# Patient Record
Sex: Female | Born: 1935 | Race: White | Hispanic: No | Smoking: Former smoker
Health system: Southern US, Community
[De-identification: ages and names within clinical notes are randomized; demographics above are authoritative.]

## PROBLEM LIST (undated history)

## (undated) DIAGNOSIS — M1990 Unspecified osteoarthritis, unspecified site: Secondary | ICD-10-CM

## (undated) DIAGNOSIS — K219 Gastro-esophageal reflux disease without esophagitis: Secondary | ICD-10-CM

## (undated) DIAGNOSIS — I10 Essential (primary) hypertension: Secondary | ICD-10-CM

## (undated) DIAGNOSIS — M549 Dorsalgia, unspecified: Secondary | ICD-10-CM

## (undated) DIAGNOSIS — E7800 Pure hypercholesterolemia, unspecified: Secondary | ICD-10-CM

## (undated) HISTORY — PX: KIDNEY STONE SURGERY: SHX686

## (undated) HISTORY — PX: HERNIA REPAIR: SHX51

## (undated) HISTORY — PX: ABDOMINAL SURGERY: SHX537

## (undated) HISTORY — PX: ABDOMINAL HYSTERECTOMY: SHX81

## (undated) HISTORY — PX: CHOLECYSTECTOMY: SHX55

---

## 2016-11-02 ENCOUNTER — Emergency Department (HOSPITAL_COMMUNITY): Payer: Medicare Other

## 2016-11-02 ENCOUNTER — Encounter (HOSPITAL_COMMUNITY): Payer: Self-pay | Admitting: Emergency Medicine

## 2016-11-02 ENCOUNTER — Inpatient Hospital Stay (HOSPITAL_COMMUNITY)
Admission: EM | Admit: 2016-11-02 | Discharge: 2016-11-07 | DRG: 291 | Disposition: A | Discharge status: Home Health | Payer: Medicare Other | Attending: Internal Medicine | Admitting: Internal Medicine

## 2016-11-02 DIAGNOSIS — Z79891 Long term (current) use of opiate analgesic: Secondary | ICD-10-CM

## 2016-11-02 DIAGNOSIS — R531 Weakness: Secondary | ICD-10-CM

## 2016-11-02 DIAGNOSIS — Z91048 Other nonmedicinal substance allergy status: Secondary | ICD-10-CM

## 2016-11-02 DIAGNOSIS — R0989 Other specified symptoms and signs involving the circulatory and respiratory systems: Secondary | ICD-10-CM | POA: Diagnosis present

## 2016-11-02 DIAGNOSIS — E876 Hypokalemia: Secondary | ICD-10-CM | POA: Diagnosis present

## 2016-11-02 DIAGNOSIS — I2510 Atherosclerotic heart disease of native coronary artery without angina pectoris: Secondary | ICD-10-CM | POA: Diagnosis present

## 2016-11-02 DIAGNOSIS — K219 Gastro-esophageal reflux disease without esophagitis: Secondary | ICD-10-CM | POA: Diagnosis present

## 2016-11-02 DIAGNOSIS — Z886 Allergy status to analgesic agent status: Secondary | ICD-10-CM | POA: Diagnosis not present

## 2016-11-02 DIAGNOSIS — G8929 Other chronic pain: Secondary | ICD-10-CM | POA: Diagnosis present

## 2016-11-02 DIAGNOSIS — R627 Adult failure to thrive: Secondary | ICD-10-CM | POA: Diagnosis present

## 2016-11-02 DIAGNOSIS — Z7902 Long term (current) use of antithrombotics/antiplatelets: Secondary | ICD-10-CM

## 2016-11-02 DIAGNOSIS — N3001 Acute cystitis with hematuria: Secondary | ICD-10-CM

## 2016-11-02 DIAGNOSIS — D649 Anemia, unspecified: Secondary | ICD-10-CM | POA: Diagnosis present

## 2016-11-02 DIAGNOSIS — R748 Abnormal levels of other serum enzymes: Secondary | ICD-10-CM | POA: Diagnosis not present

## 2016-11-02 DIAGNOSIS — R778 Other specified abnormalities of plasma proteins: Secondary | ICD-10-CM | POA: Diagnosis present

## 2016-11-02 DIAGNOSIS — G3184 Mild cognitive impairment, so stated: Secondary | ICD-10-CM | POA: Diagnosis present

## 2016-11-02 DIAGNOSIS — I739 Peripheral vascular disease, unspecified: Secondary | ICD-10-CM | POA: Diagnosis present

## 2016-11-02 DIAGNOSIS — R7989 Other specified abnormal findings of blood chemistry: Secondary | ICD-10-CM

## 2016-11-02 DIAGNOSIS — R609 Edema, unspecified: Secondary | ICD-10-CM

## 2016-11-02 DIAGNOSIS — R918 Other nonspecific abnormal finding of lung field: Secondary | ICD-10-CM | POA: Diagnosis present

## 2016-11-02 DIAGNOSIS — F419 Anxiety disorder, unspecified: Secondary | ICD-10-CM | POA: Diagnosis present

## 2016-11-02 DIAGNOSIS — Z79899 Other long term (current) drug therapy: Secondary | ICD-10-CM

## 2016-11-02 DIAGNOSIS — I130 Hypertensive heart and chronic kidney disease with heart failure and stage 1 through stage 4 chronic kidney disease, or unspecified chronic kidney disease: Secondary | ICD-10-CM | POA: Diagnosis present

## 2016-11-02 DIAGNOSIS — M6281 Muscle weakness (generalized): Secondary | ICD-10-CM

## 2016-11-02 DIAGNOSIS — E785 Hyperlipidemia, unspecified: Secondary | ICD-10-CM | POA: Diagnosis present

## 2016-11-02 DIAGNOSIS — M1990 Unspecified osteoarthritis, unspecified site: Secondary | ICD-10-CM | POA: Diagnosis present

## 2016-11-02 DIAGNOSIS — I7789 Other specified disorders of arteries and arterioles: Secondary | ICD-10-CM | POA: Diagnosis present

## 2016-11-02 DIAGNOSIS — Z881 Allergy status to other antibiotic agents status: Secondary | ICD-10-CM

## 2016-11-02 DIAGNOSIS — Z841 Family history of disorders of kidney and ureter: Secondary | ICD-10-CM

## 2016-11-02 DIAGNOSIS — N183 Chronic kidney disease, stage 3 unspecified: Secondary | ICD-10-CM

## 2016-11-02 DIAGNOSIS — I5043 Acute on chronic combined systolic (congestive) and diastolic (congestive) heart failure: Secondary | ICD-10-CM

## 2016-11-02 DIAGNOSIS — I509 Heart failure, unspecified: Secondary | ICD-10-CM | POA: Diagnosis not present

## 2016-11-02 DIAGNOSIS — L03116 Cellulitis of left lower limb: Secondary | ICD-10-CM | POA: Diagnosis present

## 2016-11-02 DIAGNOSIS — E7800 Pure hypercholesterolemia, unspecified: Secondary | ICD-10-CM | POA: Diagnosis present

## 2016-11-02 DIAGNOSIS — I10 Essential (primary) hypertension: Secondary | ICD-10-CM | POA: Diagnosis present

## 2016-11-02 DIAGNOSIS — N390 Urinary tract infection, site not specified: Secondary | ICD-10-CM | POA: Diagnosis present

## 2016-11-02 DIAGNOSIS — Z87891 Personal history of nicotine dependence: Secondary | ICD-10-CM

## 2016-11-02 DIAGNOSIS — I5041 Acute combined systolic (congestive) and diastolic (congestive) heart failure: Secondary | ICD-10-CM | POA: Diagnosis not present

## 2016-11-02 DIAGNOSIS — R600 Localized edema: Secondary | ICD-10-CM

## 2016-11-02 DIAGNOSIS — Z87442 Personal history of urinary calculi: Secondary | ICD-10-CM

## 2016-11-02 HISTORY — DX: Essential (primary) hypertension: I10

## 2016-11-02 HISTORY — DX: Pure hypercholesterolemia, unspecified: E78.00

## 2016-11-02 HISTORY — DX: Dorsalgia, unspecified: M54.9

## 2016-11-02 HISTORY — DX: Gastro-esophageal reflux disease without esophagitis: K21.9

## 2016-11-02 HISTORY — DX: Unspecified osteoarthritis, unspecified site: M19.90

## 2016-11-02 LAB — CBC WITH DIFFERENTIAL/PLATELET
BASOS ABS: 0 10*3/uL (ref 0.0–0.1)
BASOS PCT: 0 %
EOS ABS: 0 10*3/uL (ref 0.0–0.7)
Eosinophils Relative: 0 %
HEMATOCRIT: 31.8 % — AB (ref 36.0–46.0)
HEMOGLOBIN: 10.3 g/dL — AB (ref 12.0–15.0)
Lymphocytes Relative: 10 %
Lymphs Abs: 0.5 10*3/uL — ABNORMAL LOW (ref 0.7–4.0)
MCH: 31.2 pg (ref 26.0–34.0)
MCHC: 32.4 g/dL (ref 30.0–36.0)
MCV: 96.4 fL (ref 78.0–100.0)
Monocytes Absolute: 0.4 10*3/uL (ref 0.1–1.0)
Monocytes Relative: 9 %
NEUTROS ABS: 3.9 10*3/uL (ref 1.7–7.7)
NEUTROS PCT: 81 %
Platelets: 179 10*3/uL (ref 150–400)
RBC: 3.3 MIL/uL — AB (ref 3.87–5.11)
RDW: 14.5 % (ref 11.5–15.5)
WBC: 4.8 10*3/uL (ref 4.0–10.5)

## 2016-11-02 LAB — BRAIN NATRIURETIC PEPTIDE: B Natriuretic Peptide: 2954 pg/mL — ABNORMAL HIGH (ref 0.0–100.0)

## 2016-11-02 LAB — COMPREHENSIVE METABOLIC PANEL
ALBUMIN: 3.5 g/dL (ref 3.5–5.0)
ALK PHOS: 71 U/L (ref 38–126)
ALT: 21 U/L (ref 14–54)
AST: 34 U/L (ref 15–41)
Anion gap: 10 (ref 5–15)
BILIRUBIN TOTAL: 0.7 mg/dL (ref 0.3–1.2)
BUN: 40 mg/dL — AB (ref 6–20)
CALCIUM: 9.1 mg/dL (ref 8.9–10.3)
CO2: 25 mmol/L (ref 22–32)
Chloride: 107 mmol/L (ref 101–111)
Creatinine, Ser: 1.56 mg/dL — ABNORMAL HIGH (ref 0.44–1.00)
GFR calc Af Amer: 35 mL/min — ABNORMAL LOW (ref >60)
GFR calc non Af Amer: 30 mL/min — ABNORMAL LOW (ref >60)
GLUCOSE: 89 mg/dL (ref 65–99)
Potassium: 3.4 mmol/L — ABNORMAL LOW (ref 3.5–5.1)
SODIUM: 142 mmol/L (ref 135–145)
TOTAL PROTEIN: 6.3 g/dL — AB (ref 6.5–8.1)

## 2016-11-02 LAB — URINALYSIS, ROUTINE W REFLEX MICROSCOPIC
Bilirubin Urine: NEGATIVE
Glucose, UA: NEGATIVE mg/dL
KETONES UR: NEGATIVE mg/dL
NITRITE: NEGATIVE
PH: 5 (ref 5.0–8.0)
PROTEIN: 100 mg/dL — AB
Specific Gravity, Urine: 1.015 (ref 1.005–1.030)

## 2016-11-02 LAB — TROPONIN I
TROPONIN I: 0.05 ng/mL — AB (ref <0.03)
Troponin I: 0.05 ng/mL (ref <0.03)

## 2016-11-02 LAB — MAGNESIUM: MAGNESIUM: 1.7 mg/dL (ref 1.7–2.4)

## 2016-11-02 MED ORDER — PANTOPRAZOLE SODIUM 40 MG PO TBEC
40.0000 mg | DELAYED_RELEASE_TABLET | Freq: Every day | ORAL | Status: DC
  Administered 2016-11-02 – 2016-11-07 (×6): 40 mg via ORAL
  Filled 2016-11-02 (×6): qty 1

## 2016-11-02 MED ORDER — DEXTROSE 5 % IV SOLN
1.0000 g | Freq: Once | INTRAVENOUS | Status: AC
  Administered 2016-11-02: 1 g via INTRAVENOUS
  Filled 2016-11-02: qty 10

## 2016-11-02 MED ORDER — ENOXAPARIN SODIUM 30 MG/0.3ML ~~LOC~~ SOLN
30.0000 mg | SUBCUTANEOUS | Status: DC
  Administered 2016-11-02 – 2016-11-06 (×5): 30 mg via SUBCUTANEOUS
  Filled 2016-11-02 (×5): qty 0.3

## 2016-11-02 MED ORDER — FENOFIBRATE 54 MG PO TABS
54.0000 mg | ORAL_TABLET | Freq: Every day | ORAL | Status: DC
  Administered 2016-11-03 – 2016-11-07 (×5): 54 mg via ORAL
  Filled 2016-11-02 (×7): qty 1

## 2016-11-02 MED ORDER — HYDROCODONE-ACETAMINOPHEN 5-325 MG PO TABS
1.0000 | ORAL_TABLET | Freq: Four times a day (QID) | ORAL | Status: DC | PRN
  Administered 2016-11-02 – 2016-11-03 (×3): 1 via ORAL
  Filled 2016-11-02 (×3): qty 1

## 2016-11-02 MED ORDER — FESOTERODINE FUMARATE ER 4 MG PO TB24
4.0000 mg | ORAL_TABLET | Freq: Every day | ORAL | Status: DC
Start: 2016-11-03 — End: 2016-11-07
  Administered 2016-11-03 – 2016-11-07 (×5): 4 mg via ORAL
  Filled 2016-11-02 (×7): qty 1

## 2016-11-02 MED ORDER — CLORAZEPATE DIPOTASSIUM 7.5 MG PO TABS
7.5000 mg | ORAL_TABLET | Freq: Three times a day (TID) | ORAL | Status: DC
  Administered 2016-11-02 – 2016-11-07 (×15): 7.5 mg via ORAL
  Filled 2016-11-02 (×15): qty 1

## 2016-11-02 MED ORDER — POTASSIUM CHLORIDE CRYS ER 20 MEQ PO TBCR
20.0000 meq | EXTENDED_RELEASE_TABLET | Freq: Once | ORAL | Status: AC
Start: 2016-11-02 — End: 2016-11-02
  Administered 2016-11-02: 20 meq via ORAL
  Filled 2016-11-02: qty 1

## 2016-11-02 MED ORDER — METOLAZONE 5 MG PO TABS
5.0000 mg | ORAL_TABLET | Freq: Every day | ORAL | Status: DC
  Administered 2016-11-03 – 2016-11-04 (×2): 5 mg via ORAL
  Filled 2016-11-02 (×2): qty 1

## 2016-11-02 MED ORDER — FUROSEMIDE 10 MG/ML IJ SOLN
20.0000 mg | Freq: Two times a day (BID) | INTRAMUSCULAR | Status: DC
  Administered 2016-11-03 – 2016-11-04 (×3): 20 mg via INTRAVENOUS
  Filled 2016-11-02 (×3): qty 2

## 2016-11-02 MED ORDER — MAGNESIUM SULFATE 2 GM/50ML IV SOLN
2.0000 g | Freq: Once | INTRAVENOUS | Status: AC
  Administered 2016-11-03: 2 g via INTRAVENOUS
  Filled 2016-11-02: qty 50

## 2016-11-02 MED ORDER — DEXTROSE 5 % IV SOLN
1.0000 g | INTRAVENOUS | Status: DC
  Filled 2016-11-02: qty 10

## 2016-11-02 MED ORDER — SODIUM CHLORIDE 0.9 % IV BOLUS (SEPSIS)
250.0000 mL | Freq: Once | INTRAVENOUS | Status: AC
  Administered 2016-11-02: 250 mL via INTRAVENOUS

## 2016-11-02 MED ORDER — VITAMIN D (ERGOCALCIFEROL) 1.25 MG (50000 UNIT) PO CAPS
50000.0000 [IU] | ORAL_CAPSULE | ORAL | Status: DC
  Administered 2016-11-03: 50000 [IU] via ORAL
  Filled 2016-11-02: qty 1

## 2016-11-02 MED ORDER — IPRATROPIUM-ALBUTEROL 0.5-2.5 (3) MG/3ML IN SOLN: 3.0000 mL | Freq: Four times a day (QID) | RESPIRATORY_TRACT | Status: DC | PRN

## 2016-11-02 MED ORDER — FERROUS SULFATE 325 (65 FE) MG PO TABS
325.0000 mg | ORAL_TABLET | Freq: Every day | ORAL | Status: DC
  Administered 2016-11-03 – 2016-11-07 (×5): 325 mg via ORAL
  Filled 2016-11-02 (×5): qty 1

## 2016-11-02 MED ORDER — FUROSEMIDE 10 MG/ML IJ SOLN
40.0000 mg | Freq: Once | INTRAMUSCULAR | Status: AC
  Administered 2016-11-02: 40 mg via INTRAVENOUS
  Filled 2016-11-02: qty 4

## 2016-11-02 MED ORDER — BENAZEPRIL HCL 10 MG PO TABS
10.0000 mg | ORAL_TABLET | Freq: Every day | ORAL | Status: DC
Start: 2016-11-03 — End: 2016-11-07
  Administered 2016-11-03 – 2016-11-07 (×5): 10 mg via ORAL
  Filled 2016-11-02 (×6): qty 1

## 2016-11-02 MED ORDER — SODIUM CHLORIDE 0.9 % IV SOLN: INTRAVENOUS | Status: DC

## 2016-11-02 MED ORDER — CLOPIDOGREL BISULFATE 75 MG PO TABS
75.0000 mg | ORAL_TABLET | Freq: Every day | ORAL | Status: DC
Start: 2016-11-03 — End: 2016-11-07
  Administered 2016-11-03 – 2016-11-07 (×5): 75 mg via ORAL
  Filled 2016-11-02 (×5): qty 1

## 2016-11-02 MED ORDER — HYDROCODONE-ACETAMINOPHEN 5-325 MG PO TABS
1.0000 | ORAL_TABLET | Freq: Once | ORAL | Status: AC
  Administered 2016-11-02: 1 via ORAL
  Filled 2016-11-02: qty 1

## 2016-11-02 MED ORDER — POTASSIUM CHLORIDE CRYS ER 20 MEQ PO TBCR
20.0000 meq | EXTENDED_RELEASE_TABLET | Freq: Two times a day (BID) | ORAL | Status: DC
  Administered 2016-11-02 – 2016-11-05 (×7): 20 meq via ORAL
  Filled 2016-11-02 (×7): qty 1

## 2016-11-02 NOTE — ED Triage Notes (Addendum)
Patient c/o swelling in lower legs bilaterally. Per family x1 week in which she seen orthopedic doctor for back, in which family showed the doctor swelling and patient was given cortisone injections in knees. swelling in knees. Patient was told that she needed to see PCP but unable to get an appointment till next week.  Patient seen at Urgent Care and was given 5 days of Lasix. Patient legs "weeping and leathery" states family.  Per family patient c/o pain in legs and generalized weakness. Family states patient lethergic. Patient alert and oriented at this time.

## 2016-11-02 NOTE — ED Notes (Signed)
CRITICAL VALUE ALERT  Critical value received:  Troponin = 0.05  Date of notification:  11/02/16  Time of notification:  1659  Critical value read back:Yes.    Nurse who received alert:  Antony OdeaJason Nehal Witting  MD notified (1st page):  Zackowski  Time of first page:  1659  MD notified (2nd page):  Time of second page:  Responding MD:  Deretha EmoryZackowski  Time MD responded:  [phone]

## 2016-11-02 NOTE — H&P (Signed)
History and Physical    Kristy Espinoza ZOX:[id number] DOB: 1934/05/01 DOA: 11/02/2016  PCP: PROVIDER NOT IN SYSTEM   Patient coming from: Home.  Chief Complaint: Lower extremities swelling.  HPI: Kristy Espinoza is a 83 y.o. female with medical history significant of osteoarthritis, chronic back pain, GERD, hyperlipidemia, hypertension who is coming to the emergency department with progressively worse weakness and lower extremity edema for about a week. She denies chest pain, palpitations, diaphoresis, PND, orthopnea but complains of postural dizziness, dyspnea on exertion and fatigue. Patient daughter states that they went to urgent care and the patient was prescribed metolazone 5 mg by mouth daily. She denies fever, chills, abdominal pain, nausea, emesis, diarrhea or constipation. She complains of dysuria and frequency.  ED Course: The patient received Rocephin 1 g IVPB, Norco 5/325 one tablet by mouth, furosemide 40 mg IVP, KCl 20 mEq, DuoNeb and earlier to 250 mL bolus by EDP.  Her workup shows EKG with minimal ST depression on inferior leads, WBC 4.8, hemoglobin 10.3 g/dL and platelets 811. Her troponin level was 0.05 ng/mL and her BNP was 2954 pg/dL. Her sodium was 142, potassium 3.4, chloride 107, bicarbonate 25 mmol/L. Her BUN was 40, creatinine 1.56 and glucose 89 mg/dL.  Imaging: Her chest radiograph did not show any acute abnormalities. However, CT of the chest without contrast showed extensive CAD, multiple nodules  Review of Systems: As per HPI otherwise 10 point review of systems negative.    Past Medical History:  Diagnosis Date  . Arthritis   . Back pain   . GERD (gastroesophageal reflux disease)   . High cholesterol   . Hypertension     Past Surgical History:  Procedure Laterality Date  . ABDOMINAL HYSTERECTOMY    . ABDOMINAL SURGERY    . CHOLECYSTECTOMY    . HERNIA REPAIR    . KIDNEY STONE SURGERY       reports that she quit smoking about 25 years ago. Her  smoking use included Cigarettes. She has a 40.00 pack-year smoking history. She has never used smokeless tobacco. She reports that she does not drink alcohol or use drugs.  Allergies  Allergen Reactions  . Ativan [Lorazepam] Other (See Comments)    Hallucinations   . Ciprocin-Fluocin-Procin [Fluocinolone] Diarrhea  . Ibuprofen Other (See Comments)    Due to abd surgery   . Aspirin Other (See Comments)    Due to abd surgery   . Avelox [Moxifloxacin Hcl In Nacl] Rash  . Tape Rash    Family History  Problem Relation Age of Onset  . Cancer Father   . Kidney failure Mother     Prior to Admission medications   Medication Sig Start Date End Date Taking? Authorizing Provider  benazepril (LOTENSIN) 20 MG tablet Take 20 mg by mouth 2 (two) times daily.   Yes Historical Provider, MD  clopidogrel (PLAVIX) 75 MG tablet Take 75 mg by mouth daily.   Yes Historical Provider, MD  clorazepate (TRANXENE) 7.5 MG tablet Take 7.5 mg by mouth 3 (three) times daily.   Yes Historical Provider, MD  fenofibrate micronized (LOFIBRA) 67 MG capsule Take 67 mg by mouth daily before breakfast.   Yes Historical Provider, MD  ferrous sulfate 325 (65 FE) MG EC tablet Take 325 mg by mouth daily.   Yes Historical Provider, MD  HYDROcodone-acetaminophen (NORCO/VICODIN) 5-325 MG tablet Take 1 tablet by mouth every 6 (six) hours as needed for moderate pain.   Yes Historical Provider, MD  metolazone (ZAROXOLYN) 5 MG  tablet Take 5 mg by mouth daily.   Yes Historical Provider, MD  tolterodine (DETROL LA) 4 MG 24 hr capsule Take 4 mg by mouth daily.   Yes Historical Provider, MD  Vitamin D, Ergocalciferol, (DRISDOL) 50000 units CAPS capsule Take 50,000 Units by mouth every 7 (seven) days.   Yes Historical Provider, MD    Physical Exam:  Constitutional: NAD, calm, comfortable Vitals:   11/02/16 1845 11/02/16 1945 11/02/16 2000 11/02/16 2015  BP:   129/60   Pulse: 81 80 83 81  Resp: 24 23 19 15   Temp:      TempSrc:       SpO2: 100% 100% 100% 100%  Weight:       Eyes: PERRL, lids and conjunctivae are pale. ENMT: Mucous membranes are moist. Posterior pharynx clear of any exudate or lesions. Neck: normal, supple, no masses, no thyromegaly Respiratory: clear to auscultation bilaterally, no wheezing, no crackles. Normal respiratory effort. No accessory muscle use.  Cardiovascular: Regular rate and rhythm, no murmurs / rubs / gallops. 3+ bilateral lower extremity edema. 2+ pedal pulses. Left carotid bruit.  Abdomen: Soft, mild suprapubic tenderness, no masses palpated. No hepatosplenomegaly. Bowel sounds positive.  Musculoskeletal: no clubbing / cyanosis.  Positive MCP joint deformities, particularly of both thumbs. Good ROM, no contractures. Normal muscle tone.  Skin: no rashes, lesions, ulcers. No induration Neurologic: CN 2-12 grossly intact. Sensation intact, DTR normal. Strength 5/5 in all 4.  Psychiatric: Normal judgment and insight. Alert and oriented x 2, partially disoriented to time and see the patient. Normal mood.    Labs on Admission: I have personally reviewed following labs and imaging studies  CBC:  Recent Labs Lab 11/02/16 1436  WBC 4.8  NEUTROABS 3.9  HGB 10.3*  HCT 31.8*  MCV 96.4  PLT 179   Basic Metabolic Panel:  Recent Labs Lab 11/02/16 1436  NA 142  K 3.4*  CL 107  CO2 25  GLUCOSE 89  BUN 40*  CREATININE 1.56*  CALCIUM 9.1   GFR: CrCl cannot be calculated (Unknown ideal weight.). Liver Function Tests:  Recent Labs Lab 11/02/16 1436  AST 34  ALT 21  ALKPHOS 71  BILITOT 0.7  PROT 6.3*  ALBUMIN 3.5   No results for input(s): LIPASE, AMYLASE in the last 168 hours. No results for input(s): AMMONIA in the last 168 hours. Coagulation Profile: No results for input(s): INR, PROTIME in the last 168 hours. Cardiac Enzymes:  Recent Labs Lab 11/02/16 1436  TROPONINI 0.05*   BNP (last 3 results) No results for input(s): PROBNP in the last 8760  hours. HbA1C: No results for input(s): HGBA1C in the last 72 hours. CBG: No results for input(s): GLUCAP in the last 168 hours. Lipid Profile: No results for input(s): CHOL, HDL, LDLCALC, TRIG, CHOLHDL, LDLDIRECT in the last 72 hours. Thyroid Function Tests: No results for input(s): TSH, T4TOTAL, FREET4, T3FREE, THYROIDAB in the last 72 hours. Anemia Panel: No results for input(s): VITAMINB12, FOLATE, FERRITIN, TIBC, IRON, RETICCTPCT in the last 72 hours. Urine analysis:    Component Value Date/Time   COLORURINE YELLOW 11/02/2016 1437   APPEARANCEUR HAZY (A) 11/02/2016 1437   LABSPEC 1.015 11/02/2016 1437   PHURINE 5.0 11/02/2016 1437   GLUCOSEU NEGATIVE 11/02/2016 1437   HGBUR MODERATE (A) 11/02/2016 1437   BILIRUBINUR NEGATIVE 11/02/2016 1437   KETONESUR NEGATIVE 11/02/2016 1437   PROTEINUR 100 (A) 11/02/2016 1437   NITRITE NEGATIVE 11/02/2016 1437   LEUKOCYTESUR MODERATE (A) 11/02/2016 1437  Radiological Exams on Admission: Dg Chest 2 View  Result Date: 11/02/2016 CLINICAL DATA:  Leg swelling for several days EXAM: CHEST  2 VIEW COMPARISON:  None. FINDINGS: The heart size and mediastinal contours are within normal limits. Both lungs are clear. The visualized skeletal structures are unremarkable. IMPRESSION: No active cardiopulmonary disease. Electronically Signed   By: Elige Ko   On: 11/02/2016 16:39   Ct Chest Wo Contrast  Result Date: 11/02/2016 CLINICAL DATA:  Bilateral leg swelling over the last week. Recent steroid administration. EXAM: CT CHEST WITHOUT CONTRAST TECHNIQUE: Multidetector CT imaging of the chest was performed following the standard protocol without IV contrast. COMPARISON:  Chest radiography same day FINDINGS: Cardiovascular: Heart size is normal. No pericardial fluid. There is extensive coronary artery calcification. There is extensive aortic calcification without evidence of aneurysm or dissection. Proximal brachiocephalic vessels show extensive  atherosclerotic calcification. Mediastinum/Nodes: No mediastinal or hilar mass or lymphadenopathy. Lungs/Pleura: No infiltrate, collapse or effusion. There are scattered bilateral pulmonary nodules bilaterally. The largest include a 6 mm nodule in the right upper lobe anteriorly image 52, an 8 mm nodule in the right lower lobe Image 92, and a 4 mm nodule in the left lower lobe image 84. There are numerous other 2-3 mm nodules scattered within both lungs. These are likely to be benign, but do require follow-up. See below. Upper Abdomen: Indeterminate density rounded area at the upper pole of left kidney could represent a cyst or a mass. This measures up to 3.4 cm in diameter. Musculoskeletal: Chronic spinal degenerative changes. IMPRESSION: Extensive atherosclerotic disease of the coronary arteries, aorta and brachiocephalic branch vessels. Multiple pulmonary nodules as outlined above. These require further follow-up. Non-contrast chest CT at 3-6 months is recommended. If the nodules are stable at time of repeat CT, then future CT at 18-24 months (from today's scan) is considered optional for low-risk patients, but is recommended for high-risk patients. This recommendation follows the consensus statement: Guidelines for Management of Incidental Pulmonary Nodules Detected on CT Images: From the Fleischner Society 2017; Radiology 2017; 284:228-243. Up to 3.4 cm in diameter rounded entity at the upper pole the left kidney which is of indeterminate density. This could represent a cyst or mass. This could be evaluated by further cross-sectional imaging including ultrasound or CT with contrast. Electronically Signed   By: Paulina Fusi M.D.   On: 11/02/2016 17:47    EKG: Independently reviewed. Vent. rate 83 BPM PR interval * ms QRS duration 106 ms QT/QTc 370/435 ms P-R-T axes 67 18 57 Sinus rhythm Minimal ST depression, inferior leads  Assessment/Plan Principal Problem:   Generalized weakness Likely a  combination of multiple factors. Cardiac/edema, osteoarthritis, anemia, UTI. Admit to telemetry unit/inpatient. Continue treatment for UTI. Optimize electrolytes. Consult case management. May benefit from PT evaluation if feeling better in a.m.  Active Problems:   Elevated troponin   Elevated brain natriuretic peptide (BNP) level I offered transfer to [facility] for quicker evaluation tomorrow. The patient's daughter/POA prefers to stay [facility] to get an echo and cardiology evaluation on Monday. Continue cardiac monitoring and troponin levels trending. Continue Plavix 75 mg by mouth daily. Continue diuresis. Start low-dose beta blocker in a.m. if possible.    Left carotid bruit Check carotid Doppler in a.m.    Hypokalemia Replaced. Check magnesium level. Follow-up potassium level in the morning.    UTI (urinary tract infection) Continue ceftriaxone 1 g IV PB every 24 hours. Follow-up urine culture and sensitivity.    Hypertension  Currently on minoxidil 20 mg by mouth twice a day. Decrease ACE inhibitor to 10 mg by mouth daily. Started on furosemide 20 mg IVP twice a day.    Chronic renal insufficiency, stage 3 (moderate) Decrease ACE inhibitor while on IV Lasix. Follow-up renal function and electrolytes.    Anxiety Continue clorazepate 7.5 mg by mouth 3 times a day.    Hyperlipidemia On fenofibrate. Continue low fat/low cholesterol diet.    GERD (gastroesophageal reflux disease) Pantoprazole 40 mg by mouth daily.      Anemia Continue iron supplementation. Monitor hematocrit and hemoglobin.    Pulmonary nodules Discussed the need for follow-up per radiology recommendation with the patient's daughter. She voiced understanding.    Impaired memory Check B12 and TSH level. Check CT scan of the head.     DVT prophylaxis: Lovenox SQ. Code Status: Full code. Family Communication: Her daughter Agustin CreeDarlene [phone]) and son-in-law were present in the  room. Disposition Plan: Admit for cardiac biomarkers trending, IV antibiotics, echocardiogram, carotid Doppler, cardiology, PT/OT and case management/social services evaluation. Consults called: Will need cardiology consult on Monday prior POA preference. Admission status: Inpatient/telemetry.   Bobette Moavid Manuel Lawren Sexson MD [facility] Pager [phone]  If 7PM-7AM, please contact night-coverage [email or web address] Password [email or web address]  11/02/2016, 8:48 PM

## 2016-11-02 NOTE — ED Provider Notes (Signed)
[facility]-EMERGENCY DEPT Provider Note   CSN: [id number] Arrival date & time: 11/02/16  1309     History   Chief Complaint Chief Complaint  Patient presents with  . Leg Swelling    HPI Kristy Espinoza is a 83 y.o. female.  Patient is from [place]. Primary care provider is in [place]. Patient with acute onset of significant leg swelling over the last week. Seen in urgent care started on a diuretic. Symptoms got worse patient also showed some weakness. Family brought her here for evaluation. Denies any shortness of breath or chest pain. Oxygen saturations have been normal. No upper respiratory infection. No fevers.      Past Medical History:  Diagnosis Date  . Arthritis   . Back pain   . GERD (gastroesophageal reflux disease)   . High cholesterol   . Hypertension     There are no active problems to display for this patient.   Past Surgical History:  Procedure Laterality Date  . ABDOMINAL HYSTERECTOMY    . ABDOMINAL SURGERY    . CHOLECYSTECTOMY    . HERNIA REPAIR    . KIDNEY STONE SURGERY      OB History    Gravida Para Term Preterm AB Living   2 2 2     2    SAB TAB Ectopic Multiple Live Births                   Home Medications    Prior to Admission medications   Medication Sig Start Date End Date Taking? Authorizing Provider  benazepril (LOTENSIN) 20 MG tablet Take 20 mg by mouth 2 (two) times daily.   Yes Historical Provider, MD  clopidogrel (PLAVIX) 75 MG tablet Take 75 mg by mouth daily.   Yes Historical Provider, MD  clorazepate (TRANXENE) 7.5 MG tablet Take 7.5 mg by mouth 3 (three) times daily.   Yes Historical Provider, MD  fenofibrate micronized (LOFIBRA) 67 MG capsule Take 67 mg by mouth daily before breakfast.   Yes Historical Provider, MD  ferrous sulfate 325 (65 FE) MG EC tablet Take 325 mg by mouth daily.   Yes Historical Provider, MD  HYDROcodone-acetaminophen (NORCO/VICODIN) 5-325 MG tablet Take 1 tablet by mouth every 6 (six) hours as needed  for moderate pain.   Yes Historical Provider, MD  metolazone (ZAROXOLYN) 5 MG tablet Take 5 mg by mouth daily.   Yes Historical Provider, MD  tolterodine (DETROL LA) 4 MG 24 hr capsule Take 4 mg by mouth daily.   Yes Historical Provider, MD  Vitamin D, Ergocalciferol, (DRISDOL) 50000 units CAPS capsule Take 50,000 Units by mouth every 7 (seven) days.   Yes Historical Provider, MD    Family History Family History  Problem Relation Age of Onset  . Cancer Father     Social History Social History  Substance Use Topics  . Smoking status: Former Smoker    Packs/day: 1.00    Years: 40.00    Types: Cigarettes    Quit date: 09/24/1991  . Smokeless tobacco: Never Used  . Alcohol use No     Allergies   Ativan [lorazepam]; Ciprocin-fluocin-procin [fluocinolone]; Ibuprofen; Aspirin; Avelox [moxifloxacin hcl in nacl]; and Tape   Review of Systems Review of Systems  Constitutional: Negative for fever.  HENT: Negative for congestion.   Respiratory: Negative for shortness of breath.   Cardiovascular: Positive for leg swelling. Negative for chest pain.  Gastrointestinal: Negative for abdominal pain.  Genitourinary: Negative for dysuria.  Musculoskeletal: Negative for back pain.  Skin: Negative for rash.  Neurological: Negative for headaches.  Hematological: Does not bruise/bleed easily.  Psychiatric/Behavioral: Negative for confusion.     Physical Exam Updated Vital Signs BP 159/69 (BP Location: Left Arm)   Pulse 94   Temp 97.9 F (36.6 C) (Oral)   Resp 16   Wt 65.8 kg   SpO2 98%   Physical Exam  Constitutional: She is oriented to person, place, and time. She appears well-developed and well-nourished. No distress.  HENT:  Head: Normocephalic and atraumatic.  Mouth/Throat: Oropharynx is clear and moist.  Eyes: Conjunctivae and EOM are normal. Pupils are equal, round, and reactive to light.  Neck: Normal range of motion. Neck supple.  Cardiovascular: Normal rate and regular  rhythm.   Pulmonary/Chest: Effort normal and breath sounds normal.  Abdominal: Soft. Bowel sounds are normal. There is no tenderness.  Musculoskeletal: Normal range of motion. She exhibits edema.  Marked bilateral leg edema right greater than left. With some erythema anteriorly secondary to chronic swelling changes. Little bit of weeping of fluid.  Neurological: She is alert and oriented to person, place, and time. No cranial nerve deficit or sensory deficit. She exhibits normal muscle tone. Coordination normal.  Skin: Skin is warm. There is erythema.  Nursing note and vitals reviewed.    ED Treatments / Results  Labs (all labs ordered are listed, but only abnormal results are displayed) Labs Reviewed  CBC WITH DIFFERENTIAL/PLATELET - Abnormal; Notable for the following:       Result Value   RBC 3.30 (*)    Hemoglobin 10.3 (*)    HCT 31.8 (*)    Lymphs Abs 0.5 (*)    All other components within normal limits  COMPREHENSIVE METABOLIC PANEL - Abnormal; Notable for the following:    Potassium 3.4 (*)    BUN 40 (*)    Creatinine, Ser 1.56 (*)    Total Protein 6.3 (*)    GFR calc non Af Amer 30 (*)    GFR calc Af Amer 35 (*)    All other components within normal limits  BRAIN NATRIURETIC PEPTIDE - Abnormal; Notable for the following:    B Natriuretic Peptide 2,954.0 (*)    All other components within normal limits  URINALYSIS, ROUTINE W REFLEX MICROSCOPIC - Abnormal; Notable for the following:    APPearance HAZY (*)    Hgb urine dipstick MODERATE (*)    Protein, ur 100 (*)    Leukocytes, UA MODERATE (*)    Bacteria, UA RARE (*)    All other components within normal limits  TROPONIN I - Abnormal; Notable for the following:    Troponin I 0.05 (*)    All other components within normal limits  URINE CULTURE    EKG  EKG Interpretation  Date/Time:  Saturday November 02 2016 14:35:01 EST Ventricular Rate:  83 PR Interval:    QRS Duration: 106 QT Interval:  370 QTC  Calculation: 435 R Axis:   18 Text Interpretation:  Sinus rhythm Minimal ST depression, inferior leads No previous ECGs available Confirmed by Dory Verdun  MD, Selby Slovacek [phone]) on 11/02/2016 3:51:43 PM       Radiology Dg Chest 2 View  Result Date: 11/02/2016 CLINICAL DATA:  Leg swelling for several days EXAM: CHEST  2 VIEW COMPARISON:  None. FINDINGS: The heart size and mediastinal contours are within normal limits. Both lungs are clear. The visualized skeletal structures are unremarkable. IMPRESSION: No active cardiopulmonary disease. Electronically Signed   By: Elige Ko  On: 11/02/2016 16:39   Ct Chest Wo Contrast  Result Date: 11/02/2016 CLINICAL DATA:  Bilateral leg swelling over the last week. Recent steroid administration. EXAM: CT CHEST WITHOUT CONTRAST TECHNIQUE: Multidetector CT imaging of the chest was performed following the standard protocol without IV contrast. COMPARISON:  Chest radiography same day FINDINGS: Cardiovascular: Heart size is normal. No pericardial fluid. There is extensive coronary artery calcification. There is extensive aortic calcification without evidence of aneurysm or dissection. Proximal brachiocephalic vessels show extensive atherosclerotic calcification. Mediastinum/Nodes: No mediastinal or hilar mass or lymphadenopathy. Lungs/Pleura: No infiltrate, collapse or effusion. There are scattered bilateral pulmonary nodules bilaterally. The largest include a 6 mm nodule in the right upper lobe anteriorly image 52, an 8 mm nodule in the right lower lobe Image 92, and a 4 mm nodule in the left lower lobe image 84. There are numerous other 2-3 mm nodules scattered within both lungs. These are likely to be benign, but do require follow-up. See below. Upper Abdomen: Indeterminate density rounded area at the upper pole of left kidney could represent a cyst or a mass. This measures up to 3.4 cm in diameter. Musculoskeletal: Chronic spinal degenerative changes. IMPRESSION:  Extensive atherosclerotic disease of the coronary arteries, aorta and brachiocephalic branch vessels. Multiple pulmonary nodules as outlined above. These require further follow-up. Non-contrast chest CT at 3-6 months is recommended. If the nodules are stable at time of repeat CT, then future CT at 18-24 months (from today's scan) is considered optional for low-risk patients, but is recommended for high-risk patients. This recommendation follows the consensus statement: Guidelines for Management of Incidental Pulmonary Nodules Detected on CT Images: From the Fleischner Society 2017; Radiology 2017; 284:228-243. Up to 3.4 cm in diameter rounded entity at the upper pole the left kidney which is of indeterminate density. This could represent a cyst or mass. This could be evaluated by further cross-sectional imaging including ultrasound or CT with contrast. Electronically Signed   By: Paulina Fusi M.D.   On: 11/02/2016 17:47    Procedures Procedures (including critical care time)  Medications Ordered in ED Medications  cefTRIAXone (ROCEPHIN) 1 g in dextrose 5 % 50 mL IVPB (not administered)  0.9 %  sodium chloride infusion (not administered)  sodium chloride 0.9 % bolus 250 mL (not administered)  HYDROcodone-acetaminophen (NORCO/VICODIN) 5-325 MG per tablet 1 tablet (1 tablet Oral Given 11/02/16 1602)     Initial Impression / Assessment and Plan / ED Course  I have reviewed the triage vital signs and the nursing notes.  Pertinent labs & imaging results that were available during my care of the patient were reviewed by me and considered in my medical decision making (see chart for details).     Patient brought in by family. Patient with acute leg swelling. Over the last week. Patient's labs suggestive of either renal insufficiency or prerenal. Patient was slightly elevated troponin. BNP was very high but CT chest shows no evidence of congestive heart failure. Patient has significant coronary artery  disease based on the CT scan. Discussed with hospitalist. They will evaluate possible transfer to [facility].  Hospitalist was contemplating on probably trying Lasix. We also had a discussion of maybe trying a fluid challenge. Final decision was to be up to them.  Also patient with the elevated troponin could've had a missed MI. Patient without any history of chest pain.  In summary the bilateral leg swelling was definitely new patient also has generalized weakness.  Patient given a 250 mL bolus of normal  saline here.  Also based on urinalysis patient with question of urinary tract infection. She was given Rocephin for this. Urine culture sent. Patient nontoxic no acute distress.  Final Clinical Impressions(s) / ED Diagnoses   Final diagnoses:  Bilateral lower extremity edema  Acute cystitis with hematuria  Weakness    New Prescriptions New Prescriptions   No medications on file     Vanetta MuldersScott Millianna Szymborski, MD 11/03/16 [phone]

## 2016-11-03 ENCOUNTER — Inpatient Hospital Stay (HOSPITAL_COMMUNITY): Payer: Medicare Other

## 2016-11-03 DIAGNOSIS — R7989 Other specified abnormal findings of blood chemistry: Secondary | ICD-10-CM

## 2016-11-03 DIAGNOSIS — R918 Other nonspecific abnormal finding of lung field: Secondary | ICD-10-CM | POA: Diagnosis present

## 2016-11-03 DIAGNOSIS — I509 Heart failure, unspecified: Secondary | ICD-10-CM

## 2016-11-03 DIAGNOSIS — N183 Chronic kidney disease, stage 3 (moderate): Secondary | ICD-10-CM

## 2016-11-03 LAB — ECHOCARDIOGRAM COMPLETE
AVLVOTPG: 3 mmHg
CHL CUP DOP CALC LVOT VTI: 16.4 cm
CHL CUP MV DEC (S): 256
EERAT: 12.77
EWDT: 256 ms
FS: 20 % — AB (ref 28–44)
Height: 63 in
IV/PV OW: 0.91
LA ID, A-P, ES: 38 mm
LA diam end sys: 38 mm
LA vol A4C: 46.7 ml
LADIAMINDEX: 2.3 cm/m2
LAVOL: 49.8 mL
LAVOLIN: 30.2 mL/m2
LV E/e' medial: 12.77
LV PW d: 12.5 mm — AB (ref 0.6–1.1)
LV e' LATERAL: 5.77 cm/s
LVEEAVG: 12.77
LVOT SV: 47 mL
LVOT area: 2.84 cm2
LVOTD: 19 mm
LVOTPV: 92.7 cm/s
Lateral S' vel: 12.5 cm/s
MV Peak grad: 2 mmHg
MV pk A vel: 130 m/s
MVPKEVEL: 73.7 m/s
TDI e' lateral: 5.77
TDI e' medial: 4.13
Weight: 2194.02 oz

## 2016-11-03 LAB — BASIC METABOLIC PANEL
Anion gap: 8 (ref 5–15)
BUN: 41 mg/dL — AB (ref 6–20)
CHLORIDE: 107 mmol/L (ref 101–111)
CO2: 26 mmol/L (ref 22–32)
CREATININE: 1.54 mg/dL — AB (ref 0.44–1.00)
Calcium: 8.6 mg/dL — ABNORMAL LOW (ref 8.9–10.3)
GFR calc non Af Amer: 31 mL/min — ABNORMAL LOW (ref >60)
GFR, EST AFRICAN AMERICAN: 36 mL/min — AB (ref >60)
Glucose, Bld: 90 mg/dL (ref 65–99)
Potassium: 3.9 mmol/L (ref 3.5–5.1)
Sodium: 141 mmol/L (ref 135–145)

## 2016-11-03 LAB — CBC
HCT: 30.4 % — ABNORMAL LOW (ref 36.0–46.0)
Hemoglobin: 10 g/dL — ABNORMAL LOW (ref 12.0–15.0)
MCH: 31.6 pg (ref 26.0–34.0)
MCHC: 32.9 g/dL (ref 30.0–36.0)
MCV: 96.2 fL (ref 78.0–100.0)
PLATELETS: 178 10*3/uL (ref 150–400)
RBC: 3.16 MIL/uL — AB (ref 3.87–5.11)
RDW: 14.5 % (ref 11.5–15.5)
WBC: 4.7 10*3/uL (ref 4.0–10.5)

## 2016-11-03 LAB — TROPONIN I
TROPONIN I: 0.04 ng/mL — AB (ref <0.03)
TROPONIN I: 0.05 ng/mL — AB (ref <0.03)

## 2016-11-03 LAB — VITAMIN B12: Vitamin B-12: 161 pg/mL — ABNORMAL LOW (ref 180–914)

## 2016-11-03 LAB — TSH: TSH: 1.174 u[IU]/mL (ref 0.350–4.500)

## 2016-11-03 MED ORDER — HYDROCODONE-ACETAMINOPHEN 5-325 MG PO TABS
2.0000 | ORAL_TABLET | Freq: Once | ORAL | Status: AC
  Administered 2016-11-04: 2 via ORAL
  Filled 2016-11-03: qty 2

## 2016-11-03 MED ORDER — HYDROMORPHONE HCL 1 MG/ML IJ SOLN
1.0000 mg | Freq: Once | INTRAMUSCULAR | Status: AC
  Administered 2016-11-03: 1 mg via INTRAVENOUS
  Filled 2016-11-03: qty 1

## 2016-11-03 MED ORDER — HYDROCODONE-ACETAMINOPHEN 5-325 MG PO TABS
1.0000 | ORAL_TABLET | ORAL | Status: DC | PRN
Start: 2016-11-04 — End: 2016-11-07
  Administered 2016-11-04: 1 via ORAL
  Administered 2016-11-04: 2 via ORAL
  Administered 2016-11-04 (×2): 1 via ORAL
  Administered 2016-11-05 – 2016-11-07 (×9): 2 via ORAL
  Administered 2016-11-07 (×2): 1 via ORAL
  Filled 2016-11-03 (×3): qty 2
  Filled 2016-11-03: qty 1
  Filled 2016-11-03 (×5): qty 2
  Filled 2016-11-03 (×2): qty 1
  Filled 2016-11-03 (×3): qty 2
  Filled 2016-11-03 (×2): qty 1

## 2016-11-03 NOTE — Progress Notes (Signed)
  Echocardiogram 2D Echocardiogram has been performed.  Kristy Espinoza, Kristy Espinoza 11/03/2016, 4:22 PM

## 2016-11-03 NOTE — Progress Notes (Signed)
PROGRESS NOTE    Kristy Espinoza  ZOX:[id number] DOB: 11/03/1933 DOA: 11/02/2016 PCP: PROVIDER NOT IN SYSTEM     Brief Narrative:  83 year old woman admitted from home on 2/10 due to lower extremity edema right greater than left. She apparently visited an urgent care and was prescribed metolazone 5 mg daily. She denies shortness of breath, chest pain, PND and orthopnea, however has had some fatigue and postural dizziness. Admission requested   Assessment & Plan:   Principal Problem:   Generalized weakness Active Problems:   Hypokalemia   UTI (urinary tract infection)   Hypertension   Chronic renal insufficiency, stage 3 (moderate)   Anxiety   Hyperlipidemia   GERD (gastroesophageal reflux disease)   Elevated troponin   Anemia   Elevated brain natriuretic peptide (BNP) level   Left carotid bruit   Pulmonary nodules   Right greater than left lower extremity edema -Main concern at this point for DVT, Doppler requested. -Echo requested to assess LV function and diastolic function. -With BNP of close to 300 need to consider potential CHF  Elevated troponin -Flat, max troponin 0.05, no chest pain, echo pending. Do not anticipate further ischemic cardiac workup at this point.  Questionable UTI  -do not believe she has a UTI based on UA results, will DC Rocephin.  Chronic kidney disease stage III -Creatinine is at baseline of 1.5.    DVT prophylaxis: lovenox Code Status: full code Family Communication: patient only Disposition Plan: to be determined  Consultants:   None  Procedures:   ECHO pending  LE Doppler pending  Antimicrobials:  Anti-infectives    Start     Dose/Rate Route Frequency Ordered Stop   11/03/16 1830  cefTRIAXone (ROCEPHIN) 1 g in dextrose 5 % 50 mL IVPB     1 g 100 mL/hr over 30 Minutes Intravenous Every 24 hours 11/02/16 2223     11/02/16 1815  cefTRIAXone (ROCEPHIN) 1 g in dextrose 5 % 50 mL IVPB     1 g 100 mL/hr over 30 Minutes  Intravenous  Once 11/02/16 1807 11/02/16 1908       Subjective: Still with RLE pain, no SOB or Cp  Objective: Vitals:   11/02/16 2229 11/03/16 0309 11/03/16 0443 11/03/16 1504  BP: (!) 135/53  (!) 120/42 (!) 127/53  Pulse: 69  85 82  Resp:   16 18  Temp: 98.1 F (36.7 C)  97.9 F (36.6 C) 98.4 F (36.9 C)  TempSrc: Oral  Oral Oral  SpO2: 95%  99% 100%  Weight: 62.4 kg (137 lb 9.1 oz) 62.2 kg (137 lb 2 oz)    Height: 5\' 3"  (1.6 m)       Intake/Output Summary (Last 24 hours) at 11/03/16 1639 Last data filed at 11/03/16 1638  Gross per 24 hour  Intake              540 ml  Output             2025 ml  Net            -1485 ml   Filed Weights   11/02/16 1404 11/02/16 2229 11/03/16 0309  Weight: 65.8 kg (145 lb) 62.4 kg (137 lb 9.1 oz) 62.2 kg (137 lb 2 oz)    Examination:  General exam: Alert, awake, oriented x 3 Respiratory system: Clear to auscultation. Respiratory effort normal. Cardiovascular system:RRR. No murmurs, rubs, gallops. Gastrointestinal system: Abdomen is nondistended, soft and nontender. No organomegaly or masses felt. Normal bowel sounds heard.  Central nervous system: Alert and oriented. No focal neurological deficits. Extremities: R>>L LE edema and redness Skin: No rashes, lesions or ulcers Psychiatry: Judgement and insight appear normal. Mood & affect appropriate.     Data Reviewed: I have personally reviewed following labs and imaging studies  CBC:  Recent Labs Lab 11/02/16 1436 11/03/16 0414  WBC 4.8 4.7  NEUTROABS 3.9  --   HGB 10.3* 10.0*  HCT 31.8* 30.4*  MCV 96.4 96.2  PLT 179 178   Basic Metabolic Panel:  Recent Labs Lab 11/02/16 1436 11/02/16 2118 11/03/16 0414  NA 142  --  141  K 3.4*  --  3.9  CL 107  --  107  CO2 25  --  26  GLUCOSE 89  --  90  BUN 40*  --  41*  CREATININE 1.56*  --  1.54*  CALCIUM 9.1  --  8.6*  MG  --  1.7  --    GFR: Estimated Creatinine Clearance: 24.1 mL/min (by C-G formula based on SCr of  1.54 mg/dL (H)). Liver Function Tests:  Recent Labs Lab 11/02/16 1436  AST 34  ALT 21  ALKPHOS 71  BILITOT 0.7  PROT 6.3*  ALBUMIN 3.5   No results for input(s): LIPASE, AMYLASE in the last 168 hours. No results for input(s): AMMONIA in the last 168 hours. Coagulation Profile: No results for input(s): INR, PROTIME in the last 168 hours. Cardiac Enzymes:  Recent Labs Lab 11/02/16 1436 11/02/16 2118 11/03/16 0414 11/03/16 1125  TROPONINI 0.05* 0.05* 0.04* 0.05*   BNP (last 3 results) No results for input(s): PROBNP in the last 8760 hours. HbA1C: No results for input(s): HGBA1C in the last 72 hours. CBG: No results for input(s): GLUCAP in the last 168 hours. Lipid Profile: No results for input(s): CHOL, HDL, LDLCALC, TRIG, CHOLHDL, LDLDIRECT in the last 72 hours. Thyroid Function Tests:  Recent Labs  11/03/16 0414  TSH 1.174   Anemia Panel: No results for input(s): VITAMINB12, FOLATE, FERRITIN, TIBC, IRON, RETICCTPCT in the last 72 hours. Urine analysis:    Component Value Date/Time   COLORURINE YELLOW 11/02/2016 1437   APPEARANCEUR HAZY (A) 11/02/2016 1437   LABSPEC 1.015 11/02/2016 1437   PHURINE 5.0 11/02/2016 1437   GLUCOSEU NEGATIVE 11/02/2016 1437   HGBUR MODERATE (A) 11/02/2016 1437   BILIRUBINUR NEGATIVE 11/02/2016 1437   KETONESUR NEGATIVE 11/02/2016 1437   PROTEINUR 100 (A) 11/02/2016 1437   NITRITE NEGATIVE 11/02/2016 1437   LEUKOCYTESUR MODERATE (A) 11/02/2016 1437   Sepsis Labs: @LABRCNTIP (procalcitonin:4,lacticidven:4)  )No results found for this or any previous visit (from the past 240 hour(s)).       Radiology Studies: Dg Chest 2 View  Result Date: 11/02/2016 CLINICAL DATA:  Leg swelling for several days EXAM: CHEST  2 VIEW COMPARISON:  None. FINDINGS: The heart size and mediastinal contours are within normal limits. Both lungs are clear. The visualized skeletal structures are unremarkable. IMPRESSION: No active cardiopulmonary  disease. Electronically Signed   By: Elige Ko   On: 11/02/2016 16:39   Ct Chest Wo Contrast  Result Date: 11/02/2016 CLINICAL DATA:  Bilateral leg swelling over the last week. Recent steroid administration. EXAM: CT CHEST WITHOUT CONTRAST TECHNIQUE: Multidetector CT imaging of the chest was performed following the standard protocol without IV contrast. COMPARISON:  Chest radiography same day FINDINGS: Cardiovascular: Heart size is normal. No pericardial fluid. There is extensive coronary artery calcification. There is extensive aortic calcification without evidence of aneurysm or dissection. Proximal brachiocephalic vessels  show extensive atherosclerotic calcification. Mediastinum/Nodes: No mediastinal or hilar mass or lymphadenopathy. Lungs/Pleura: No infiltrate, collapse or effusion. There are scattered bilateral pulmonary nodules bilaterally. The largest include a 6 mm nodule in the right upper lobe anteriorly image 52, an 8 mm nodule in the right lower lobe Image 92, and a 4 mm nodule in the left lower lobe image 84. There are numerous other 2-3 mm nodules scattered within both lungs. These are likely to be benign, but do require follow-up. See below. Upper Abdomen: Indeterminate density rounded area at the upper pole of left kidney could represent a cyst or a mass. This measures up to 3.4 cm in diameter. Musculoskeletal: Chronic spinal degenerative changes. IMPRESSION: Extensive atherosclerotic disease of the coronary arteries, aorta and brachiocephalic branch vessels. Multiple pulmonary nodules as outlined above. These require further follow-up. Non-contrast chest CT at 3-6 months is recommended. If the nodules are stable at time of repeat CT, then future CT at 18-24 months (from today's scan) is considered optional for low-risk patients, but is recommended for high-risk patients. This recommendation follows the consensus statement: Guidelines for Management of Incidental Pulmonary Nodules Detected on  CT Images: From the Fleischner Society 2017; Radiology 2017; 284:228-243. Up to 3.4 cm in diameter rounded entity at the upper pole the left kidney which is of indeterminate density. This could represent a cyst or mass. This could be evaluated by further cross-sectional imaging including ultrasound or CT with contrast. Electronically Signed   By: Paulina Fusi M.D.   On: 11/02/2016 17:47        Scheduled Meds: . benazepril  10 mg Oral Daily  . cefTRIAXone (ROCEPHIN)  IV  1 g Intravenous Q24H  . clopidogrel  75 mg Oral Daily  . clorazepate  7.5 mg Oral TID  . enoxaparin (LOVENOX) injection  30 mg Subcutaneous Q24H  . fenofibrate  54 mg Oral Daily  . ferrous sulfate  325 mg Oral Daily  . fesoterodine  4 mg Oral Daily  . furosemide  20 mg Intravenous BID  . metolazone  5 mg Oral Daily  . pantoprazole  40 mg Oral Daily  . potassium chloride  20 mEq Oral BID  . Vitamin D (Ergocalciferol)  50,000 Units Oral Q7 days   Continuous Infusions:   LOS: 1 day    Time spent: 25 minutes. Greater than 50% of this time was spent in direct contact with the patient coordinating care.     Chaya Jan, MD [facility] Pager [phone]  If 7PM-7AM, please contact night-coverage [email or web address] Password [facility] 11/03/2016, 4:39 PM

## 2016-11-03 NOTE — Progress Notes (Signed)
Patient stated that her top dentures are missing and that she had them in the emergency department.  Called the emergency department to have them look for the missing dentures.  Checked all the patients belongings and left a message for the patients daughter incase the daughter took them home.  Will continue to monitor the situation while awaiting a return call from the ED and or family.

## 2016-11-03 NOTE — Progress Notes (Signed)
Lab notified me of he patient having a troponin of 0.05.  MD was notified. No new orders at this time.

## 2016-11-04 ENCOUNTER — Inpatient Hospital Stay (HOSPITAL_COMMUNITY): Payer: Medicare Other

## 2016-11-04 DIAGNOSIS — I5041 Acute combined systolic (congestive) and diastolic (congestive) heart failure: Secondary | ICD-10-CM

## 2016-11-04 DIAGNOSIS — I5043 Acute on chronic combined systolic (congestive) and diastolic (congestive) heart failure: Secondary | ICD-10-CM

## 2016-11-04 LAB — BASIC METABOLIC PANEL
ANION GAP: 10 (ref 5–15)
BUN: 45 mg/dL — AB (ref 6–20)
CALCIUM: 8.8 mg/dL — AB (ref 8.9–10.3)
CO2: 24 mmol/L (ref 22–32)
Chloride: 105 mmol/L (ref 101–111)
Creatinine, Ser: 1.73 mg/dL — ABNORMAL HIGH (ref 0.44–1.00)
GFR calc Af Amer: 31 mL/min — ABNORMAL LOW (ref >60)
GFR, EST NON AFRICAN AMERICAN: 27 mL/min — AB (ref >60)
GLUCOSE: 83 mg/dL (ref 65–99)
Potassium: 4.6 mmol/L (ref 3.5–5.1)
Sodium: 139 mmol/L (ref 135–145)

## 2016-11-04 MED ORDER — METOPROLOL SUCCINATE ER 25 MG PO TB24
12.5000 mg | ORAL_TABLET | Freq: Every day | ORAL | Status: DC
  Administered 2016-11-04 – 2016-11-07 (×4): 12.5 mg via ORAL
  Filled 2016-11-04 (×4): qty 1

## 2016-11-04 NOTE — Care Management Note (Signed)
Case Management Note  Patient Details  Name: Herbie SaxonMartha Asman MRN: [id number] Date of Birth: 05/12/1934  Subjective/Objective:    Patient adm from home with weakness, leg pain/swelling. She lives alone, has family support. Daughter at bedside. States patient has had HH in the past with [facility] home health and are agreeable to using them again. PT recommends HH PT,  3 in 1 and RW.             Action/Plan: Alroy BailiffLinda [facility] need [facility] and 3 in 1. Patient has received Standard walker in past few years. Bonita QuinLinda will run cost of RW and let family know price. [facility]    Expected Discharge Date:  11/04/16               Expected Discharge Plan:  Home w Home Health Services  In-House Referral:  NA  Discharge planning Services  CM Consult  Post Acute Care Choice:  Home Health Choice offered to:  Patient, Adult Children  DME Arranged:  3-N-1, Walker rolling DME Agency:  [facility]  [facility] Arr[facility]  [facility]  Status of Service:  In process, will continue to follow  If discussed at Long Length of Stay Meetings, dates discussed:    Additional Comments:  Merrit Friesen, Chrystine OilerSharley Diane, RN 11/04/2016, 11:48 AM

## 2016-11-04 NOTE — Consult Note (Signed)
CARDIOLOGY CONSULT NOTE   Patient ID: Kristy Espinoza MRN: [id number] DOB/AGE: 81/12/1933 81 y.o.  Admit Date: 11/02/2016 Referring Physician: TRH-Hernandez Primary Physician: PROVIDER NOT IN SYSTEM Consulting Cardiologist: Dina RichBranch, Darnise Montag MD Primary Cardiologist: New Reason for Consultation: Abnormal Echocardiogram with reduced EF  Clinical Summary Kristy Espinoza is a 83 y.o.female with No prior cardiac history but history of hypertension, hyperlipidemia, GERD, and chronic musculoskeletal pain. She presented to the ER with progressively worse weakness and LEE. Was seen in Urgent Care for these symptoms and prescribed metolazone 5 mg daily. The patient's daughter at bedside states that approximately one week ago she was complaining of lower extremity edema and pain and was seen by her orthopedic physician Kristy Espinoza. Kristy Espinoza normally provides her with pain control. The patient is not very active at home. She is able to do her own ADLs, and some light housekeeping only. She denies significant change in her level at activity over the last several months.  The patient was sent to primary care for follow-up on the lower extremity edema. The patient was unable to go see her primary care and continued to have worsening lower extremity edema for approximately one week. The patient was seen by urgent care as described and asked to follow up with primary care. Due to worsening symptoms of fatigue she presented to the emergency room. According to the daughter her primary care physician placed her on Plavix for "lower extremity edema" and she is uncertain why she was placed on that but did not question it.  On arrival to ER BP 125/52. HR 76, afebrile. Troponin 0.05, BNP 2, 954. Hgb 10.3 and Hct 31.8. CT scan revealed extensive atherosclerotic disease of the coronary arteries. EKG revealed normal sinus rhythm, no evidence of acute ST-T wave abnormalities. There was also a 3.4 cm rounded entity on the  upper pole of the left kidney which was inderminate for cyst or mass. CXR was negative for CHF or pneumonia.   She was treated with lasix, 40 mg IV, and continues on lasix 20 mg BID. She is also on ACE. She has diuresed 3.5 L since admission. Primary care has done a right venous Doppler ultrasound which ruled out DVT.  Allergies  Allergen Reactions  . Ativan [Lorazepam] Other (See Comments)    Hallucinations   . Ciprocin-Fluocin-Procin [Fluocinolone] Diarrhea  . Ibuprofen Other (See Comments)    Due to abd surgery   . Aspirin Other (See Comments)    Due to abd surgery   . Avelox [Moxifloxacin Hcl In Nacl] Rash  . Tape Rash    Medications Scheduled Medications: . benazepril  10 mg Oral Daily  . clopidogrel  75 mg Oral Daily  . clorazepate  7.5 mg Oral TID  . enoxaparin (LOVENOX) injection  30 mg Subcutaneous Q24H  . fenofibrate  54 mg Oral Daily  . ferrous sulfate  325 mg Oral Daily  . fesoterodine  4 mg Oral Daily  . furosemide  20 mg Intravenous BID  . pantoprazole  40 mg Oral Daily  . potassium chloride  20 mEq Oral BID  . Vitamin D (Ergocalciferol)  50,000 Units Oral Q7 days     Infusions:   PRN Medications:  HYDROcodone-acetaminophen, ipratropium-albuterol   Past Medical History:  Diagnosis Date  . Arthritis   . Back pain   . GERD (gastroesophageal reflux disease)   . High cholesterol   . Hypertension     Past Surgical History:  Procedure Laterality Date  . ABDOMINAL HYSTERECTOMY    .  ABDOMINAL SURGERY    . CHOLECYSTECTOMY    . HERNIA REPAIR    . KIDNEY STONE SURGERY      Family History  Problem Relation Age of Onset  . Cancer Father   . Kidney failure Mother      Social History Kristy Espinoza reports that she quit smoking about 25 years ago. Her smoking use included Cigarettes. She has a 40.00 pack-year smoking history. She has never used smokeless tobacco. Kristy Espinoza reports that she does not drink alcohol.  Review of Systems Complete  review of systems are found to be negative unless outlined in H&P above.  Physical Examination Blood pressure (!) 147/97, pulse (!) 102, temperature 97.8 F (36.6 C), temperature source Oral, resp. rate 18, height 5\' 3"  (1.6 m), weight 131 lb 6.3 oz (59.6 kg), SpO2 99 %.  Intake/Output Summary (Last 24 hours) at 11/04/16 1155 Last data filed at 11/04/16 0818  Gross per 24 hour  Intake              720 ml  Output             2625 ml  Net            -1905 ml    Telemetry: Normal sinus rhythm occasional PVC  GEN: Frail no acute distress HEENT: Conjunctiva and lids normal, oropharynx clear with moist mucosa. Neck: Supple, no elevated JVP or carotid bruits, no thyromegaly. Lungs: Expiratory expiratory wheezes are noted with basilar crackles. Occasional cough nonproductive Cardiac: Regular rate and rhythm, distant, no S3 or significant systolic murmur, no pericardial rub. Abdomen: Soft, nontender, no hepatomegaly, bowel sounds present, no guarding or rebound. Extremities: No pitting edema, distal pulses diminished, erythema is noted bilaterally with open sores on the right pretibial area, thickened nail beds bilaterally, exquisitely painful to palpation. Skin: Warm and dry. Musculoskeletal: No kyphosis. Neuropsychiatric: Alert and oriented x3, affect grossly appropriate.  Prior Cardiac Testing/Procedures 11/03/2016 Left ventricle: The cavity size was normal. Wall thickness was   increased in a pattern of mild LVH. The estimated ejection   fraction was 40%. Diffuse hypokinesis. Doppler parameters are   consistent with abnormal left ventricular relaxation (grade 1   diastolic dysfunction). - Aortic valve: Mildly calcified annulus. Trileaflet; mildly   calcified leaflets. Moderate calcification involving the right   coronary cusp. - Mitral valve: Calcified annulus. There was mild regurgitation. - Left atrium: The atrium was mildly dilated. - Right atrium: Central venous pressure (est): 3  mm Hg. - Atrial septum: A patent foramen ovale cannot be excluded. - Tricuspid valve: There was trivial regurgitation. - Pulmonary arteries: Systolic pressure could not be accurately   estimated. - Pericardium, extracardiac: There was no pericardial effusion.  Lab Results  Basic Metabolic Panel:  Recent Labs Lab 11/02/16 1436 11/02/16 2118 11/03/16 0414 11/04/16 0430  NA 142  --  141 139  K 3.4*  --  3.9 4.6  CL 107  --  107 105  CO2 25  --  26 24  GLUCOSE 89  --  90 83  BUN 40*  --  41* 45*  CREATININE 1.56*  --  1.54* 1.73*  CALCIUM 9.1  --  8.6* 8.8*  MG  --  1.7  --   --     Liver Function Tests:  Recent Labs Lab 11/02/16 1436  AST 34  ALT 21  ALKPHOS 71  BILITOT 0.7  PROT 6.3*  ALBUMIN 3.5    CBC:  Recent Labs Lab 11/02/16 1436 11/03/16  0414  WBC 4.8 4.7  NEUTROABS 3.9  --   HGB 10.3* 10.0*  HCT 31.8* 30.4*  MCV 96.4 96.2  PLT 179 178    Cardiac Enzymes:  Recent Labs Lab 11/02/16 1436 11/02/16 2118 11/03/16 0414 11/03/16 1125  TROPONINI 0.05* 0.05* 0.04* 0.05*    BNP: Invalid input(s): POCBNP   Radiology: Dg Chest 2 View  Result Date: 11/02/2016 CLINICAL DATA:  Leg swelling for several days EXAM: CHEST  2 VIEW COMPARISON:  None. FINDINGS: The heart size and mediastinal contours are within normal limits. Both lungs are clear. The visualized skeletal structures are unremarkable. IMPRESSION: No active cardiopulmonary disease. Electronically Signed   By: Elige Ko   On: 11/02/2016 16:39   Ct Chest Wo Contrast  Result Date: 11/02/2016 CLINICAL DATA:  Bilateral leg swelling over the last week. Recent steroid administration. EXAM: CT CHEST WITHOUT CONTRAST TECHNIQUE: Multidetector CT imaging of the chest was performed following the standard protocol without IV contrast. COMPARISON:  Chest radiography same day FINDINGS: Cardiovascular: Heart size is normal. No pericardial fluid. There is extensive coronary artery calcification. There is  extensive aortic calcification without evidence of aneurysm or dissection. Proximal brachiocephalic vessels show extensive atherosclerotic calcification. Mediastinum/Nodes: No mediastinal or hilar mass or lymphadenopathy. Lungs/Pleura: No infiltrate, collapse or effusion. There are scattered bilateral pulmonary nodules bilaterally. The largest include a 6 mm nodule in the right upper lobe anteriorly image 52, an 8 mm nodule in the right lower lobe Image 92, and a 4 mm nodule in the left lower lobe image 84. There are numerous other 2-3 mm nodules scattered within both lungs. These are likely to be benign, but do require follow-up. See below. Upper Abdomen: Indeterminate density rounded area at the upper pole of left kidney could represent a cyst or a mass. This measures up to 3.4 cm in diameter. Musculoskeletal: Chronic spinal degenerative changes. IMPRESSION: Extensive atherosclerotic disease of the coronary arteries, aorta and brachiocephalic Edmund Holcomb vessels. Multiple pulmonary nodules as outlined above. These require further follow-up. Non-contrast chest CT at 3-6 months is recommended. If the nodules are stable at time of repeat CT, then future CT at 18-24 months (from today's scan) is considered optional for low-risk patients, but is recommended for high-risk patients. This recommendation follows the consensus statement: Guidelines for Management of Incidental Pulmonary Nodules Detected on CT Images: From the Fleischner Society 2017; Radiology 2017; 284:228-243. Up to 3.4 cm in diameter rounded entity at the upper pole the left kidney which is of indeterminate density. This could represent a cyst or mass. This could be evaluated by further cross-sectional imaging including ultrasound or CT with contrast. Electronically Signed   By: Paulina Fusi M.D.   On: 11/02/2016 17:47   US Venous Img Lower Unilateral Right  Result Date: 11/04/2016 CLINICAL DATA:  Right lower extremity edema for 1 week EXAM: RIGHT LOWER  EXTREMITY VENOUS DUPLEX ULTRASOUND TECHNIQUE: Doppler venous assessment of the right lower extremity deep venous system was performed, including characterization of spectral flow, compressibility, and phasicity. COMPARISON:  None. FINDINGS: There is complete compressibility of the right common femoral, femoral, and popliteal veins. Doppler analysis demonstrates respiratory phasicity and augmentation of flow with calf compression. No obvious superficial vein or calf vein thrombosis. IMPRESSION: No evidence of right lower extremity DVT. Electronically Signed   By: Jolaine Click M.D.   On: 11/04/2016 09:43     ECG: Normal sinus rhythm, LVH is noted.   Impression and Recommendations  1. 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 The patient has always had complaints of lower extremity pain related to muscle skeletal issues, but lower extremity edema has been more prominent over the last week.  She denies chest pain and palpitations or dyspnea. She has diuresed greater than 3 L since admission with significant improvement in lower extremity edema. Legs are exquisitely tender but have some wrinkling, and some associated venous venous stasis skin changes with evidence of erythema.  CT scan reveals coronary artery calcifications. May need to consider evaluation with cardiac catheterization for definitive evaluation of coronary anatomy and need for intervention. This will be discussed further with Dr. Wyline Mood, and has not yet been discussed with the patient or family.  Continue ACE inhibitor, continue IV Lasix, she is not yet on a beta blocker, low-dose may be indicated however she does have some active wheezing. Review of telemetry does not show evidence of ventricular ectopy.  Cardiovascular risk factors include hypertension, hypercholesterolemia, age, remote history of smoking.  2. Hypertension: Blood  pressure is labile, continue to monitor closely with upper blood pressure to be lower with reduced EF. Continue ACE inhibitor. Creatinine 1.56.  3. Hypercholesterolemia: Fasting lipids and LFTs are indicated. Will order.  4. Questionable peripheral arterial disease: Difficult to palpate for pulses. Feet are cool with difficult to heal wounds on the legs. May need to consider further evaluation with ABIs.  Signed: Bettey Mare. Lawrence NP [facility]  11/04/2016, 11:55 AM Co-Sign MD  Patient seen and discussed with NP Lyman Bishop, I agree with her documentation above. 83 yo female history of HL, HTN admitted with weakness and LE edema. She was recently started on metolazone 5mg  daily at an urgent care visit for her swelling.    Hgb 10.3 Plt 179 K 3.4 Cr 1.56 GFR 30 BNP 2950 Mg 1.7 TSH 1.17  Trop 0.05-->0.05-->0.04  CXR no acute process CT chest extensive CAD, aortic calcifications. Multiple nodules with f/u needed 3-6 months. Left renal density Echo LVEF 40%, diffuse hypokinesis, grade I diastolic dysfunction, mild MR EKG SR, no ischemic changes  Acute systolic/diastolic HF. Echo with LVEF 40% and grade I diastolic dysfunction. She had been started on metolazone as outpatient, discontinued on admission. On lasix 20mg  IV bid, negative 2 liters yesterday and 3.5 liters since admission. Uptrend in Cr and BUN. Hold evening lasix. Continue ACE-I at this time, start beta blocker Toprol XL 12.5mg  daily.Given her mild systolic dysfunction, advanced age, and GFR of 30 would not pursue ischemic testing at this time, focus on medical therapy. From what I gather she is on plavix for primary prevention in the setting of aspirin allergy. Lipid panel pending, given her CAD on CT scan consider statin pending results.    Dominga Ferry MD

## 2016-11-04 NOTE — Progress Notes (Signed)
PROGRESS NOTE    Kristy SaxonMartha Espinoza  GNF:[id number] DOB: 10/16/1933 DOA: 11/02/2016 PCP: PROVIDER NOT IN SYSTEM     Brief Narrative:  83 year old woman admitted from home on 2/10 due to lower extremity edema right greater than left. She apparently visited an urgent care and was prescribed metolazone 5 mg daily. She denies shortness of breath, chest pain, PND and orthopnea, however has had some fatigue and postural dizziness. Admission requested   Assessment & Plan:   Principal Problem:   Acute on chronic combined systolic and diastolic CHF (congestive heart failure) (HCC) Active Problems:   Generalized weakness   Hypokalemia   UTI (urinary tract infection)   Hypertension   Chronic renal insufficiency, stage 3 (moderate)   Anxiety   Hyperlipidemia   GERD (gastroesophageal reflux disease)   Elevated troponin   Anemia   Elevated brain natriuretic peptide (BNP) level   Left carotid bruit   Pulmonary nodules   Acute Combined CHF -ECHO with EF 40% and grade 1 diastolic dysfunction. -Continue ACE I, started on toprol XL. -Lipid panel pending; consider statin initiation. -Is 3.3 L negative since admission. -Holding pm dose of lasix given rising Cr. -Appreciate cardiology input and recommendations. -Given concerns for asymmetrical LE edema a venous US was performed that was negative for DVT.  Elevated troponin -Flat, max troponin 0.05, no chest pain, due to acute CHF. -See ECHO results above.  Acute on chronic kidney disease stage III -Creatinine at baseline is around 1.5. -Increased to 1.7 with diuresis. -Holding pm dose of lasix.    DVT prophylaxis: lovenox Code Status: full code Family Communication: patient only Disposition Plan: to be determined  Consultants:   None  Procedures:   Antimicrobials:  Anti-infectives    Start     Dose/Rate Route Frequency Ordered Stop   11/03/16 1830  cefTRIAXone (ROCEPHIN) 1 g in dextrose 5 % 50 mL IVPB  Status:  Discontinued      1 g 100 mL/hr over 30 Minutes Intravenous Every 24 hours 11/02/16 2223 11/03/16 1643   11/02/16 1815  cefTRIAXone (ROCEPHIN) 1 g in dextrose 5 % 50 mL IVPB     1 g 100 mL/hr over 30 Minutes Intravenous  Once 11/02/16 1807 11/02/16 1908       Subjective: Still with RLE pain, no SOB or Cp  Objective: Vitals:   11/03/16 1504 11/03/16 2135 11/04/16 0654 11/04/16 1449  BP: (!) 127/53 (!) 156/53 (!) 147/97 (!) 106/58  Pulse: 82 89 (!) 102 83  Resp: 18 18 18 16   Temp: 98.4 F (36.9 C) 98.2 F (36.8 C) 97.8 F (36.6 C) 98.5 F (36.9 C)  TempSrc: Oral Oral Oral Oral  SpO2: 100% 100% 99% 98%  Weight:   59.6 kg (131 lb 6.3 oz)   Height:        Intake/Output Summary (Last 24 hours) at 11/04/16 1748 Last data filed at 11/04/16 1617  Gross per 24 hour  Intake              720 ml  Output             2600 ml  Net            -1880 ml   Filed Weights   11/02/16 2229 11/03/16 0309 11/04/16 0654  Weight: 62.4 kg (137 lb 9.1 oz) 62.2 kg (137 lb 2 oz) 59.6 kg (131 lb 6.3 oz)    Examination:  General exam: Alert, awake, oriented x 3 Respiratory system: Clear to auscultation. Respiratory effort  normal. Cardiovascular system:RRR. No murmurs, rubs, gallops. Gastrointestinal system: Abdomen is nondistended, soft and nontender. No organomegaly or masses felt. Normal bowel sounds heard. Central nervous system: Alert and oriented. No focal neurological deficits. Extremities: R>>L LE edema and redness Skin: No rashes, lesions or ulcers Psychiatry: Judgement and insight appear normal. Mood & affect appropriate.     Data Reviewed: I have personally reviewed following labs and imaging studies  CBC:  Recent Labs Lab 11/02/16 1436 11/03/16 0414  WBC 4.8 4.7  NEUTROABS 3.9  --   HGB 10.3* 10.0*  HCT 31.8* 30.4*  MCV 96.4 96.2  PLT 179 178   Basic Metabolic Panel:  Recent Labs Lab 11/02/16 1436 11/02/16 2118 11/03/16 0414 11/04/16 0430  NA 142  --  141 139  K 3.4*  --  3.9  4.6  CL 107  --  107 105  CO2 25  --  26 24  GLUCOSE 89  --  90 83  BUN 40*  --  41* 45*  CREATININE 1.56*  --  1.54* 1.73*  CALCIUM 9.1  --  8.6* 8.8*  MG  --  1.7  --   --    GFR: Estimated Creatinine Clearance: 21.5 mL/min (by C-G formula based on SCr of 1.73 mg/dL (H)). Liver Function Tests:  Recent Labs Lab 11/02/16 1436  AST 34  ALT 21  ALKPHOS 71  BILITOT 0.7  PROT 6.3*  ALBUMIN 3.5   No results for input(s): LIPASE, AMYLASE in the last 168 hours. No results for input(s): AMMONIA in the last 168 hours. Coagulation Profile: No results for input(s): INR, PROTIME in the last 168 hours. Cardiac Enzymes:  Recent Labs Lab 11/02/16 1436 11/02/16 2118 11/03/16 0414 11/03/16 1125  TROPONINI 0.05* 0.05* 0.04* 0.05*   BNP (last 3 results) No results for input(s): PROBNP in the last 8760 hours. HbA1C: No results for input(s): HGBA1C in the last 72 hours. CBG: No results for input(s): GLUCAP in the last 168 hours. Lipid Profile: No results for input(s): CHOL, HDL, LDLCALC, TRIG, CHOLHDL, LDLDIRECT in the last 72 hours. Thyroid Function Tests:  Recent Labs  11/03/16 0414  TSH 1.174   Anemia Panel:  Recent Labs  11/03/16 0414  VITAMINB12 161*   Urine analysis:    Component Value Date/Time   COLORURINE YELLOW 11/02/2016 1437   APPEARANCEUR HAZY (A) 11/02/2016 1437   LABSPEC 1.015 11/02/2016 1437   PHURINE 5.0 11/02/2016 1437   GLUCOSEU NEGATIVE 11/02/2016 1437   HGBUR MODERATE (A) 11/02/2016 1437   BILIRUBINUR NEGATIVE 11/02/2016 1437   KETONESUR NEGATIVE 11/02/2016 1437   PROTEINUR 100 (A) 11/02/2016 1437   NITRITE NEGATIVE 11/02/2016 1437   LEUKOCYTESUR MODERATE (A) 11/02/2016 1437   Sepsis Labs: @LABRCNTIP (procalcitonin:4,lacticidven:4)  )No results found for this or any previous visit (from the past 240 hour(s)).       Radiology Studies: US Venous Img Lower Unilateral Right  Result Date: 11/04/2016 CLINICAL DATA:  Right lower extremity  edema for 1 week EXAM: RIGHT LOWER EXTREMITY VENOUS DUPLEX ULTRASOUND TECHNIQUE: Doppler venous assessment of the right lower extremity deep venous system was performed, including characterization of spectral flow, compressibility, and phasicity. COMPARISON:  None. FINDINGS: There is complete compressibility of the right common femoral, femoral, and popliteal veins. Doppler analysis demonstrates respiratory phasicity and augmentation of flow with calf compression. No obvious superficial vein or calf vein thrombosis. IMPRESSION: No evidence of right lower extremity DVT. Electronically Signed   By: Jolaine Click M.D.   On: 11/04/2016 09:43  Scheduled Meds: . benazepril  10 mg Oral Daily  . clopidogrel  75 mg Oral Daily  . clorazepate  7.5 mg Oral TID  . enoxaparin (LOVENOX) injection  30 mg Subcutaneous Q24H  . fenofibrate  54 mg Oral Daily  . ferrous sulfate  325 mg Oral Daily  . fesoterodine  4 mg Oral Daily  . metoprolol succinate  12.5 mg Oral Daily  . pantoprazole  40 mg Oral Daily  . potassium chloride  20 mEq Oral BID  . Vitamin D (Ergocalciferol)  50,000 Units Oral Q7 days   Continuous Infusions:   LOS: 2 days    Time spent: 25 minutes. Greater than 50% of this time was spent in direct contact with the patient coordinating care.     Chaya Jan, MD [facility] Pager [phone]  If 7PM-7AM, please contact night-coverage [email or web address] Password [facility] 11/04/2016, 5:48 PM

## 2016-11-04 NOTE — Evaluation (Signed)
Physical Therapy Evaluation Patient Details Name: Kristy Espinoza MRN: [id number] DOB: [birth year] Today's Date: 11/04/2016   History of Present Illness  83 y.o. female with medical history significant of osteoarthritis, chronic back pain, GERD, hyperlipidemia, hypertension who is coming to the emergency department with progressively worse weakness and lower extremity edema for about a week. She denies chest pain, palpitations, diaphoresis, PND, orthopnea but complains of postural dizziness, dyspnea on exertion and fatigue. Patient daughter states that they went to urgent care and the patient was prescribed metolazone 5 mg by mouth daily. She denies fever, chills, abdominal pain, nausea, emesis, diarrhea or constipation. She complains of dysuria and frequency.  chest radiograph did not show any acute abnormalities. However, CT of the chest without contrast showed extensive CAD, multiple nodules.    Clinical Impression  Pt received in bed, dtr and son in law present, and pt was agreeable to PT evaluation.  Pt is c/o increase R LE burning sensation during evaluation and pt is noted to have very reddened and weeping LE's.  Pt states that she normally uses a standard walker for household ambulation.  Her dtr assists with running errands.  Pt is normally independent with dressing, but requires supervision for bathing.  During PT evaluation, she ambulated 7680ft with RW and Min guard/supervision.  Educated pt on safe transfer technique and encouraged her to use her UE's to assist with lowering herself slowly down in to the chair.  Dtr present and states that she has multiple family members that check in on her throughout the day.  Recommend that she return home with HHPT, RW and BSC upon d/c.      Follow Up Recommendations Home health PT    Equipment Recommendations  Rolling walker with 5" wheels;3in1 (PT) (Pt has a standard walker.  Pt may have to pay out of pocket if she wants a RW due to obtaining the  standard walker ~3 yrs ago. )    Recommendations for Other Services       Precautions / Restrictions Precautions Precautions: Fall Precaution Comments: Pt slid off the comode 2 weeks ago.   Restrictions Weight Bearing Restrictions: No      Mobility  Bed Mobility Overal bed mobility: Needs Assistance Bed Mobility: Supine to Sit     Supine to sit: Supervision        Transfers Overall transfer level: Needs assistance Equipment used: Rolling walker (2 wheeled) Transfers: Sit to/from Stand Sit to Stand: Min guard;Supervision         General transfer comment: Educated pt on using UE's to assist with pushing up from the bed.  Pt demonstrates excellent technique when in a chair with arms, but when she does not have arms she pulls back on the RW.  Pt was assisted with using the BSC prior to ambulation.   Ambulation/Gait Ambulation/Gait assistance: Min guard;Supervision Ambulation Distance (Feet): 80 Feet Assistive device: Rolling walker (2 wheeled) Gait Pattern/deviations: Step-through pattern;Antalgic     General Gait Details: Pt c/o increase R foot pain with gait.    Stairs            Wheelchair Mobility    Modified Rankin (Stroke Patients Only)       Balance Overall balance assessment: History of Falls;Needs assistance Sitting-balance support: Bilateral upper extremity supported;Feet supported Sitting balance-Leahy Scale: Good     Standing balance support: Bilateral upper extremity supported Standing balance-Leahy Scale: Fair  Pertinent Vitals/Pain Pain Assessment: No/denies pain (initially states no pain, but expresses pain in R LE with ambulation. )    Home Living   Living Arrangements: Alone Available Help at Discharge: Family;Neighbor (daily checks from the family, and neighbor checks the mail.  ) Type of Home: Mobile home Home Access: Stairs to enter   [employer] of Steps: 5, and then 1  on the back.   Home Layout: One level Home Equipment: Walker - standard;Cane - single point      Prior Function Level of Independence: Needs assistance;Independent with assistive device(s)   Gait / Transfers Assistance Needed: Pt ambulates household distances with a standard walker.    ADL's / Homemaking Assistance Needed: Pt is able to make light meals independently.  Pt requires supervision for bathing, and independent with dressing.          Hand Dominance        Extremity/Trunk Assessment   Upper Extremity Assessment Upper Extremity Assessment: Generalized weakness (Noted arthritic deformities in B hands. )    Lower Extremity Assessment Lower Extremity Assessment: Generalized weakness    Cervical / Trunk Assessment Cervical / Trunk Assessment: Kyphotic  Communication   Communication: HOH  Cognition Arousal/Alertness: Awake/alert Behavior During Therapy: WFL for tasks assessed/performed Overall Cognitive Status: Within Functional Limits for tasks assessed                 General Comments: Pt is forgetful, and askes the same question multiple times.     General Comments      Exercises     Assessment/Plan    PT Assessment Patient needs continued PT services  PT Problem List Decreased strength;Decreased activity tolerance;Decreased balance;Decreased knowledge of use of DME;Decreased safety awareness;Decreased skin integrity          PT Treatment Interventions DME instruction;Gait training;Functional mobility training;Therapeutic activities;Therapeutic exercise;Balance training;Patient/family education    PT Goals (Current goals can be found in the Care Plan section)  Acute Rehab PT Goals Patient Stated Goal: To go home.  PT Goal Formulation: With patient/family Time For Goal Achievement: 11/11/16 Potential to Achieve Goals: Fair    Frequency Min 3X/week   Barriers to discharge Decreased caregiver support Pt lives alone, but dtr states she has  multiple family members who are at the house throughout the day.      Co-evaluation               End of Session Equipment Utilized During Treatment: Gait belt Activity Tolerance: Patient tolerated treatment well Patient left: in chair;with call bell/phone within reach;with family/visitor present Nurse Communication: Mobility status Misty Stanley, RN notified of pt's mobility status.  Mobilith sheet left hanging in the room.  )    Functional Assessment Tool Used: [employer] AM-PAC "6-clicks"  Functional Limitation: Mobility: Walking and moving around Mobility: Walking and Moving Around Current Status [phone]): At least 20 percent but less than 40 percent impaired, limited or restricted Mobility: Walking and Moving Around Goal Status [phone]): At least 1 percent but less than 20 percent impaired, limited or restricted    Time: 1040-1119 PT Time Calculation (min) (ACUTE ONLY): 39 min   Charges:   PT Evaluation $PT Eval Low Complexity: 1 Procedure PT Treatments $Gait Training: 8-22 mins $Therapeutic Activity: 8-22 mins   PT G Codes:   PT G-Codes **NOT FOR INPATIENT CLASS** Functional Assessment Tool Used: [employer] "6-clicks"  Functional Limitation: Mobility: Walking and moving around Mobility: Walking and Moving Around Current Status [phone]): At least 20 percent  but less than 40 percent impaired, limited or restricted Mobility: Walking and Moving Around Goal Status [phone]): At least 1 percent but less than 20 percent impaired, limited or restricted    Beth Lizzy Hamre, PT, DPT X: [phone]

## 2016-11-04 NOTE — Progress Notes (Addendum)
Patient is irate.  Arguing with staff about the amount of times she has had bowel movements.  Patient has had only one bowel movement since 1900 but is insistent that she has had more than the one.  Explained to the patient that we keep a record of her bowel movements and urination.  Patient is still argumentative but unsure what she is arguing about.  Patient complaining of pain and pain medication given.  Will continue to monitor and tech will sit in the patients room for a while to hopefully calm the patient down so the patient can sleep.

## 2016-11-05 LAB — BASIC METABOLIC PANEL
ANION GAP: 8 (ref 5–15)
BUN: 45 mg/dL — ABNORMAL HIGH (ref 6–20)
CHLORIDE: 103 mmol/L (ref 101–111)
CO2: 26 mmol/L (ref 22–32)
Calcium: 9.6 mg/dL (ref 8.9–10.3)
Creatinine, Ser: 1.81 mg/dL — ABNORMAL HIGH (ref 0.44–1.00)
GFR calc non Af Amer: 25 mL/min — ABNORMAL LOW (ref >60)
GFR, EST AFRICAN AMERICAN: 29 mL/min — AB (ref >60)
Glucose, Bld: 93 mg/dL (ref 65–99)
POTASSIUM: 4.8 mmol/L (ref 3.5–5.1)
Sodium: 137 mmol/L (ref 135–145)

## 2016-11-05 LAB — LIPID PANEL
Cholesterol: 147 mg/dL (ref 0–200)
HDL: 46 mg/dL (ref >40)
LDL Cholesterol: 58 mg/dL (ref 0–99)
TRIGLYCERIDES: 217 mg/dL — AB (ref <150)
Total CHOL/HDL Ratio: 3.2 RATIO
VLDL: 43 mg/dL — AB (ref 0–40)

## 2016-11-05 LAB — URINE CULTURE

## 2016-11-05 MED ORDER — ATORVASTATIN CALCIUM 10 MG PO TABS
10.0000 mg | ORAL_TABLET | Freq: Every day | ORAL | Status: DC
  Administered 2016-11-05 – 2016-11-06 (×2): 10 mg via ORAL
  Filled 2016-11-05 (×2): qty 1

## 2016-11-05 NOTE — Progress Notes (Signed)
Patient c/o severe nerve like pain in right leg.  Patient is moderately confused.  Patient has been reoriented several times, but becomes belligerent when trying to reorient patient.  Lotion rubbed on patient legs for comfort and ice applied.  Will give pain med and continue to monitor patient.

## 2016-11-05 NOTE — Plan of Care (Signed)
Problem: Pain Managment: Goal: General experience of comfort will improve Outcome: Not Progressing Pt c/o pain in lower legs this shift and has received pain medication.

## 2016-11-05 NOTE — Progress Notes (Signed)
PROGRESS NOTE    Kristy Espinoza  ZOX:[id number] DOB: August 03, 1934 DOA: 11/02/2016 PCP: PROVIDER NOT IN SYSTEM     Brief Narrative:  83 year old woman admitted from home on 2/10 due to lower extremity edema right greater than left. She apparently visited an urgent care and was prescribed metolazone 5 mg daily. She denies shortness of breath, chest pain, PND and orthopnea, however has had some fatigue and postural dizziness. Admission requested. Found to have acute combined CHF on ECHO.   Assessment & Plan:   Principal Problem:   Acute on chronic combined systolic and diastolic CHF (congestive heart failure) (HCC) Active Problems:   Generalized weakness   Hypokalemia   UTI (urinary tract infection)   Hypertension   Chronic renal insufficiency, stage 3 (moderate)   Anxiety   Hyperlipidemia   GERD (gastroesophageal reflux disease)   Elevated troponin   Anemia   Elevated brain natriuretic peptide (BNP) level   Left carotid bruit   Pulmonary nodules   Acute Combined CHF -ECHO with EF 40% and grade 1 diastolic dysfunction. -Continue ACE I, started on toprol XL. -LDL 58; will not start statin at present. -Is 4.8 L negative since admission. -Volume status improved. Still with R>L LE edema. -Holding lasix today as Cr continues to rise. -Appreciate cardiology input and recommendations. -Given concerns for asymmetrical LE edema a venous US was performed that was negative for DVT. -Plan to DC home in am on lasix 20 mg daily as long as Cr is stable to decreased with close OP f/u for monitoring of renal function.  Elevated troponin -Flat, max troponin 0.05, no chest pain, due to acute CHF. -See ECHO results above.  Acute on chronic kidney disease stage III -Creatinine at baseline is around 1.5. -Increased to 1.8 with diuresis. -Holding lasix today (see above for details).    DVT prophylaxis: lovenox Code Status: full code Family Communication: patient only Disposition Plan:  to be determined  Consultants:   None  Procedures:   Antimicrobials:  Anti-infectives    Start     Dose/Rate Route Frequency Ordered Stop   11/03/16 1830  cefTRIAXone (ROCEPHIN) 1 g in dextrose 5 % 50 mL IVPB  Status:  Discontinued     1 g 100 mL/hr over 30 Minutes Intravenous Every 24 hours 11/02/16 2223 11/03/16 1643   11/02/16 1815  cefTRIAXone (ROCEPHIN) 1 g in dextrose 5 % 50 mL IVPB     1 g 100 mL/hr over 30 Minutes Intravenous  Once 11/02/16 1807 11/02/16 1908       Subjective: Still with RLE pain, no SOB or Cp  Objective: Vitals:   11/04/16 1449 11/04/16 2018 11/05/16 0500 11/05/16 0640  BP: (!) 106/58 (!) 144/44  (!) 132/51  Pulse: 83 79  76  Resp: 16 16  16   Temp: 98.5 F (36.9 C) 98.7 F (37.1 C)  100 F (37.8 C)  TempSrc: Oral Oral  Oral  SpO2: 98% 100%  97%  Weight:   58.1 kg (128 lb 1.6 oz)   Height:        Intake/Output Summary (Last 24 hours) at 11/05/16 1732 Last data filed at 11/05/16 1150  Gross per 24 hour  Intake              200 ml  Output             1700 ml  Net            -1500 ml   Filed Weights   11/03/16 0309  11/04/16 0654 11/05/16 0500  Weight: 62.2 kg (137 lb 2 oz) 59.6 kg (131 lb 6.3 oz) 58.1 kg (128 lb 1.6 oz)    Examination:  General exam: Alert, awake, oriented x 3 Respiratory system: Clear to auscultation. Respiratory effort normal. Cardiovascular system:RRR. No murmurs, rubs, gallops. Gastrointestinal system: Abdomen is nondistended, soft and nontender. No organomegaly or masses felt. Normal bowel sounds heard. Central nervous system: Alert and oriented. No focal neurological deficits. Extremities: R>>L LE edema and redness Skin: No rashes, lesions or ulcers Psychiatry: Judgement and insight appear normal. Mood & affect appropriate.     Data Reviewed: I have personally reviewed following labs and imaging studies  CBC:  Recent Labs Lab 11/02/16 1436 11/03/16 0414  WBC 4.8 4.7  NEUTROABS 3.9  --   HGB 10.3*  10.0*  HCT 31.8* 30.4*  MCV 96.4 96.2  PLT 179 178   Basic Metabolic Panel:  Recent Labs Lab 11/02/16 1436 11/02/16 2118 11/03/16 0414 11/04/16 0430 11/05/16 0629  NA 142  --  141 139 137  K 3.4*  --  3.9 4.6 4.8  CL 107  --  107 105 103  CO2 25  --  26 24 26   GLUCOSE 89  --  90 83 93  BUN 40*  --  41* 45* 45*  CREATININE 1.56*  --  1.54* 1.73* 1.81*  CALCIUM 9.1  --  8.6* 8.8* 9.6  MG  --  1.7  --   --   --    GFR: Estimated Creatinine Clearance: 20.5 mL/min (by C-G formula based on SCr of 1.81 mg/dL (H)). Liver Function Tests:  Recent Labs Lab 11/02/16 1436  AST 34  ALT 21  ALKPHOS 71  BILITOT 0.7  PROT 6.3*  ALBUMIN 3.5   No results for input(s): LIPASE, AMYLASE in the last 168 hours. No results for input(s): AMMONIA in the last 168 hours. Coagulation Profile: No results for input(s): INR, PROTIME in the last 168 hours. Cardiac Enzymes:  Recent Labs Lab 11/02/16 1436 11/02/16 2118 11/03/16 0414 11/03/16 1125  TROPONINI 0.05* 0.05* 0.04* 0.05*   BNP (last 3 results) No results for input(s): PROBNP in the last 8760 hours. HbA1C: No results for input(s): HGBA1C in the last 72 hours. CBG: No results for input(s): GLUCAP in the last 168 hours. Lipid Profile:  Recent Labs  11/05/16 0629  CHOL 147  HDL 46  LDLCALC 58  TRIG 217*  CHOLHDL 3.2   Thyroid Function Tests:  Recent Labs  11/03/16 0414  TSH 1.174   Anemia Panel:  Recent Labs  11/03/16 0414  VITAMINB12 161*   Urine analysis:    Component Value Date/Time   COLORURINE YELLOW 11/02/2016 1437   APPEARANCEUR HAZY (A) 11/02/2016 1437   LABSPEC 1.015 11/02/2016 1437   PHURINE 5.0 11/02/2016 1437   GLUCOSEU NEGATIVE 11/02/2016 1437   HGBUR MODERATE (A) 11/02/2016 1437   BILIRUBINUR NEGATIVE 11/02/2016 1437   KETONESUR NEGATIVE 11/02/2016 1437   PROTEINUR 100 (A) 11/02/2016 1437   NITRITE NEGATIVE 11/02/2016 1437   LEUKOCYTESUR MODERATE (A) 11/02/2016 1437   Sepsis  Labs: @LABRCNTIP (procalcitonin:4,lacticidven:4)  ) Recent Results (from the past 240 hour(s))  Urine culture     Status: Abnormal   Collection Time: 11/02/16  2:37 PM  Result Value Ref Range Status   Specimen Description URINE, CLEAN CATCH  Final   Special Requests NONE  Final   Culture MULTIPLE SPECIES PRESENT, SUGGEST RECOLLECTION (A)  Final   Report Status 11/05/2016 FINAL  Final  Radiology Studies: US Venous Img Lower Unilateral Right  Result Date: 11/04/2016 CLINICAL DATA:  Right lower extremity edema for 1 week EXAM: RIGHT LOWER EXTREMITY VENOUS DUPLEX ULTRASOUND TECHNIQUE: Doppler venous assessment of the right lower extremity deep venous system was performed, including characterization of spectral flow, compressibility, and phasicity. COMPARISON:  None. FINDINGS: There is complete compressibility of the right common femoral, femoral, and popliteal veins. Doppler analysis demonstrates respiratory phasicity and augmentation of flow with calf compression. No obvious superficial vein or calf vein thrombosis. IMPRESSION: No evidence of right lower extremity DVT. Electronically Signed   By: Jolaine Click M.D.   On: 11/04/2016 09:43        Scheduled Meds: . atorvastatin  10 mg Oral q1800  . benazepril  10 mg Oral Daily  . clopidogrel  75 mg Oral Daily  . clorazepate  7.5 mg Oral TID  . enoxaparin (LOVENOX) injection  30 mg Subcutaneous Q24H  . fenofibrate  54 mg Oral Daily  . ferrous sulfate  325 mg Oral Daily  . fesoterodine  4 mg Oral Daily  . metoprolol succinate  12.5 mg Oral Daily  . pantoprazole  40 mg Oral Daily  . potassium chloride  20 mEq Oral BID  . Vitamin D (Ergocalciferol)  50,000 Units Oral Q7 days   Continuous Infusions:   LOS: 3 days    Time spent: 25 minutes. Greater than 50% of this time was spent in direct contact with the patient coordinating care.     Chaya Jan, MD [facility] Pager [phone]  If 7PM-7AM,  please contact night-coverage [email or web address] Password [facility] 11/05/2016, 5:32 PM

## 2016-11-05 NOTE — Progress Notes (Signed)
Subjective:    No SOB  Objective:   Temp:  [98.5 F (36.9 C)-100 F (37.8 C)] 100 F (37.8 C) (02/13 0640) Pulse Rate:  [76-83] 76 (02/13 0640) Resp:  [16] 16 (02/13 0640) BP: (106-144)/(44-58) 132/51 (02/13 0640) SpO2:  [97 %-100 %] 97 % (02/13 0640) Weight:  [128 lb 1.6 oz (58.1 kg)] 128 lb 1.6 oz (58.1 kg) (02/13 0500) Last BM Date: 11/02/16  Filed Weights   11/03/16 0309 11/04/16 0654 11/05/16 0500  Weight: 137 lb 2 oz (62.2 kg) 131 lb 6.3 oz (59.6 kg) 128 lb 1.6 oz (58.1 kg)    Intake/Output Summary (Last 24 hours) at 11/05/16 0828 Last data filed at 11/05/16 0044  Gross per 24 hour  Intake              440 ml  Output             1400 ml  Net             -960 ml    Telemetry:SR  Exam:  General: NAD  HEENT: sclera clear, throat clear  Resp: CTAB  Cardiac: RRR, no m/r/g, no jvd  GI: abdomen soft, NT, ND  MSK: trace bilateral LE edema  Neuro: no focal deficits  Psych: appropriate affect  Lab Results:  Basic Metabolic Panel:  Recent Labs Lab 11/02/16 2118 11/03/16 0414 11/04/16 0430 11/05/16 0629  NA  --  141 139 137  K  --  3.9 4.6 4.8  CL  --  107 105 103  CO2  --  26 24 26   GLUCOSE  --  90 83 93  BUN  --  41* 45* 45*  CREATININE  --  1.54* 1.73* 1.81*  CALCIUM  --  8.6* 8.8* 9.6  MG 1.7  --   --   --     Liver Function Tests:  Recent Labs Lab 11/02/16 1436  AST 34  ALT 21  ALKPHOS 71  BILITOT 0.7  PROT 6.3*  ALBUMIN 3.5    CBC:  Recent Labs Lab 11/02/16 1436 11/03/16 0414  WBC 4.8 4.7  HGB 10.3* 10.0*  HCT 31.8* 30.4*  MCV 96.4 96.2  PLT 179 178    Cardiac Enzymes:  Recent Labs Lab 11/02/16 2118 11/03/16 0414 11/03/16 1125  TROPONINI 0.05* 0.04* 0.05*    BNP: No results for input(s): PROBNP in the last 8760 hours.  Coagulation: No results for input(s): INR in the last 168 hours.  ECG:   Medications:   Scheduled Medications: . benazepril  10 mg Oral Daily  . clopidogrel  75 mg Oral Daily    . clorazepate  7.5 mg Oral TID  . enoxaparin (LOVENOX) injection  30 mg Subcutaneous Q24H  . fenofibrate  54 mg Oral Daily  . ferrous sulfate  325 mg Oral Daily  . fesoterodine  4 mg Oral Daily  . metoprolol succinate  12.5 mg Oral Daily  . pantoprazole  40 mg Oral Daily  . potassium chloride  20 mEq Oral BID  . Vitamin D (Ergocalciferol)  50,000 Units Oral Q7 days     Infusions:   PRN Medications:  HYDROcodone-acetaminophen, ipratropium-albuterol     Assessment/Plan     1.Acute combined systolic/diastolic HF - Echo LVEF 40%, diffuse hypokinesis, grade I diastolic dysfunction, mild MR.  - Negative 1 liter yesterday, negative 4.5 liters since admission. Uptrend in Cr and  BUN, would hold diuretics today. - would plan for oral lasix 20mg  daily at discharge,  ok to take additional 20mg  for weight gain above 3 pounds - medical therapy with benazepril 10mg  , Toprol XL 12.5. Consider further titration at follow up - would not pursue ischemic testing at this time given her poor renal function. CT did show evidence of CAD. Mild flat troponin elevation on admission likely due to CHF and CKD, do not suspect ACS.   - appears plavix started by pcp for primary prevention in setting of aspirin allergy.  - given CT findings of CAD would start statin, LDL baseline 58, in this setting and with her advanced age would start atorva 10mg  daily.   - would hold diuretics today and tomorrow. Can start 20mg  daily at discharge, and take additional 20mg  for weight gain above 3 lbs. We will arrange outpatient f/u in 1 week with labs. Reasonable for d/c today from cardiac standpoint. We will sign off inpatient care. We will arrange f/u in 1 week and BMET/Mg.         Dina RichJonathan Giovanna Kemmerer, M.D., F.A.C.C.Patient ID: Kristy SaxonMartha Louischarles, female   DOB: [birth year], 83 y.o.   MRN: [id number]

## 2016-11-06 ENCOUNTER — Inpatient Hospital Stay (HOSPITAL_COMMUNITY): Payer: Medicare Other

## 2016-11-06 DIAGNOSIS — L03116 Cellulitis of left lower limb: Secondary | ICD-10-CM

## 2016-11-06 DIAGNOSIS — R748 Abnormal levels of other serum enzymes: Secondary | ICD-10-CM

## 2016-11-06 LAB — BASIC METABOLIC PANEL
Anion gap: 8 (ref 5–15)
BUN: 44 mg/dL — ABNORMAL HIGH (ref 6–20)
CALCIUM: 9.2 mg/dL (ref 8.9–10.3)
CO2: 26 mmol/L (ref 22–32)
Chloride: 102 mmol/L (ref 101–111)
Creatinine, Ser: 1.86 mg/dL — ABNORMAL HIGH (ref 0.44–1.00)
GFR calc non Af Amer: 24 mL/min — ABNORMAL LOW (ref >60)
GFR, EST AFRICAN AMERICAN: 28 mL/min — AB (ref >60)
Glucose, Bld: 99 mg/dL (ref 65–99)
Potassium: 5.2 mmol/L — ABNORMAL HIGH (ref 3.5–5.1)
SODIUM: 136 mmol/L (ref 135–145)

## 2016-11-06 MED ORDER — DEXTROSE 5 % IV SOLN
1.0000 g | INTRAVENOUS | Status: DC
  Administered 2016-11-06 – 2016-11-07 (×2): 1 g via INTRAVENOUS
  Filled 2016-11-06 (×3): qty 10

## 2016-11-06 MED ORDER — OXYCODONE HCL 5 MG PO TABS
5.0000 mg | ORAL_TABLET | Freq: Once | ORAL | Status: AC
  Administered 2016-11-06: 5 mg via ORAL
  Filled 2016-11-06: qty 1

## 2016-11-06 MED ORDER — POTASSIUM CHLORIDE CRYS ER 20 MEQ PO TBCR: 20.0000 meq | EXTENDED_RELEASE_TABLET | Freq: Two times a day (BID) | ORAL | Status: DC

## 2016-11-06 MED ORDER — FUROSEMIDE 20 MG PO TABS
20.0000 mg | ORAL_TABLET | Freq: Every day | ORAL | Status: DC
  Administered 2016-11-06 – 2016-11-07 (×2): 20 mg via ORAL
  Filled 2016-11-06 (×2): qty 1

## 2016-11-06 NOTE — Progress Notes (Signed)
Physical Therapy Treatment Patient Details Name: Kristy Espinoza MRN: [id number] DOB: 02-Nov-1933 Today's Date: 11/06/2016    History of Present Illness 83 y.o. female with medical history significant of osteoarthritis, chronic back pain, GERD, hyperlipidemia, hypertension who is coming to the emergency department with progressively worse weakness and lower extremity edema for about a week. She denies chest pain, palpitations, diaphoresis, PND, orthopnea but complains of postural dizziness, dyspnea on exertion and fatigue. Patient daughter states that they went to urgent care and the patient was prescribed metolazone 5 mg by mouth daily. She denies fever, chills, abdominal pain, nausea, emesis, diarrhea or constipation. She complains of dysuria and frequency.  chest radiograph did not show any acute abnormalities. However, CT of the chest without contrast showed extensive CAD, multiple nodules.    PT Comments    Pt progressing overall toward goals. She is able to AMB 25% farther today than when evalauted on 2/12. The patient also tolerates multiple transfers fr strengthening, all without pain that was limiting at previous session. Session is most limited by reports of weakness and limited functional tolerance in BLE. No changes to POC at this time.     Follow Up Recommendations  Home health PT     Equipment Recommendations  Rolling walker with 5" wheels;3in1 (PT)    Recommendations for Other Services       Precautions / Restrictions Precautions Precautions: Fall Precaution Comments: Pt slid off the comode 2 weeks ago.   Restrictions Weight Bearing Restrictions: No    Mobility  Bed Mobility Overal bed mobility:  (received in chair. )                Transfers Overall transfer level: Needs assistance Equipment used: Rolling walker (2 wheeled) Transfers: Sit to/from Stand Sit to Stand: Min guard;Supervision         General transfer comment: 2x5 with rest break between    Ambulation/Gait Ambulation/Gait assistance: Min guard;Supervision Ambulation Distance (Feet): 100 Feet Assistive device: Rolling walker (2 wheeled) Gait Pattern/deviations: Step-through pattern;Antalgic   Gait velocity interpretation: <1.8 ft/sec, indicative of risk for recurrent falls General Gait Details: reports legts are getting tired.    Stairs            Wheelchair Mobility    Modified Rankin (Stroke Patients Only)       Balance Overall balance assessment: History of Falls;Needs assistance                                  Cognition Arousal/Alertness: Awake/alert Behavior During Therapy: WFL for tasks assessed/performed Overall Cognitive Status: No family/caregiver present to determine baseline cognitive functioning                 General Comments: Pt is forgetful, and askes the same question multiple times.     Exercises      General Comments        Pertinent Vitals/Pain Pain Assessment: No/denies pain    Home Living                      Prior Function            PT Goals (current goals can now be found in the care plan section) Acute Rehab PT Goals Patient Stated Goal: To go home.  PT Goal Formulation: With patient/family Time For Goal Achievement: 11/11/16 Potential to Achieve Goals: Fair Progress towards PT goals: Progressing toward goals  Frequency    Min 3X/week      PT Plan Current plan remains appropriate    Co-evaluation             End of Session Equipment Utilized During Treatment: Gait belt Activity Tolerance: Patient tolerated treatment well Patient left: in chair;with call bell/phone within reach;with family/visitor present     Time: [id number] PT Time Calculation (min) (ACUTE ONLY): 23 min  Charges:  $Gait Training: 8-22 mins $Therapeutic Activity: 8-22 mins                    G Codes:      3:38 PM, 11/06/16 Rosamaria LintsAllan C Sharay Bellissimo, PT, DPT Physical Therapist - [facility] [phone](ASCOM)  [phone] (mobile)

## 2016-11-06 NOTE — Plan of Care (Signed)
Problem: Cardiac: Goal: Ability to achieve and maintain adequate cardiopulmonary perfusion will improve Education given to both patient and daughter by daytime nurse on heart failure (decreasing sodium intake, daily weights)

## 2016-11-06 NOTE — Progress Notes (Signed)
[facility] PROGRESS NOTE  Kristy Espinoza ION:[id number] DOB: [birth year] DOA: 11/02/2016 PCP: PROVIDER NOT IN SYSTEM   Principal Problem:   Acute on chronic combined systolic and diastolic CHF (congestive heart failure) (HCC) Active Problems:   Generalized weakness   Hypokalemia   UTI (urinary tract infection)   Hypertension   Chronic renal insufficiency, stage 3 (moderate)   Anxiety   Hyperlipidemia   GERD (gastroesophageal reflux disease)   Elevated troponin   Anemia   Elevated brain natriuretic peptide (BNP) level   Left carotid bruit   Pulmonary nodules  Brief Narrative:  83 year old woman admitted from home on 2/10 due to lower extremity edema right greater than left. She apparently visited an urgent care and was prescribed metolazone 5 mg daily. She denies shortness of breath, chest pain, PND and orthopnea, however has had some fatigue and postural dizziness. Admission requested. Found to have acute combined CHF on ECHO. Received diuresis with lasix. Also noted left leg cellulitis ans started on antibiotics   Assessment & Plan:  Acute Combined CHF. ECHO with EF 40% and grade 1 diastolic dysfunction.. -received diuresis with lasix, 4.8 L negative since admission. Volume status improved. Still with R>L LE edema. Holding lasix as Cr continues to rise. Restarted lasix today, hold potassium.. Continue ACE I, started on toprol XL. -Appreciate cardiology input and recommendations.  Left leg cellulitis. Several ulcers, erythema, warm, painful to touch. Given concerns for asymmetrical LE edema a venous US was performed that was negative for DVT. -started on ceftriaxone on 2/14>>. Check arterial doppler r/o PAD  Elevated troponin. Flat, max troponin 0.05, no chest pain, due to acute CHF. See ECHO results above. No plans for ischemic work up inpatient per cardiology. On plavix   Acute on chronic kidney disease stage III. Creatinine at baseline is around 1.5. Increased to 1.8  with diuresis. Monitor   Code Status: full Family Communication: d/w patient, rn (indicate person spoken with, relationship, and if by phone, the number) Disposition Plan: home 24-48 hrs    Consultants:  Cardiology   Procedures:  Echo   Pend doppler artery legs  Antibiotics:  Ceftriaxone 2/14>>> (indicate start date, and stop date if known)  HPI/Subjective: Reports left foot pain, redness, tenderness. Afebrile. No acute chest pains. SOB is better   Objective: Vitals:   11/05/16 2200 11/06/16 0458  BP: 132/81 (!) 144/44  Pulse: 75 85  Resp: 16 16  Temp: 98.1 F (36.7 C) 99.8 F (37.7 C)    Intake/Output Summary (Last 24 hours) at 11/06/16 1013 Last data filed at 11/06/16 0507  Gross per 24 hour  Intake                0 ml  Output              800 ml  Net             -800 ml   Filed Weights   11/04/16 0654 11/05/16 0500 11/06/16 0458  Weight: 59.6 kg (131 lb 6.3 oz) 58.1 kg (128 lb 1.6 oz) 59.2 kg (130 lb 9.6 oz)    Exam:   General:  No distress   Cardiovascular: s1,s2 rrr  Respiratory: few crackles LL  Abdomen: soft, nt, nd   Musculoskeletal: left leg erythema, warm, tender, mild edema    Data Reviewed: Basic Metabolic Panel:  Recent Labs Lab 11/02/16 1436 11/02/16 2118 11/03/16 0414 11/04/16 0430 11/05/16 0629 11/06/16 0533  NA 142  --  141 139 137 136  K  3.4*  --  3.9 4.6 4.8 5.2*  CL 107  --  107 105 103 102  CO2 25  --  26 24 26 26   GLUCOSE 89  --  90 83 93 99  BUN 40*  --  41* 45* 45* 44*  CREATININE 1.56*  --  1.54* 1.73* 1.81* 1.86*  CALCIUM 9.1  --  8.6* 8.8* 9.6 9.2  MG  --  1.7  --   --   --   --    Liver Function Tests:  Recent Labs Lab 11/02/16 1436  AST 34  ALT 21  ALKPHOS 71  BILITOT 0.7  PROT 6.3*  ALBUMIN 3.5   No results for input(s): LIPASE, AMYLASE in the last 168 hours. No results for input(s): AMMONIA in the last 168 hours. CBC:  Recent Labs Lab 11/02/16 1436 11/03/16 0414  WBC 4.8 4.7  NEUTROABS  3.9  --   HGB 10.3* 10.0*  HCT 31.8* 30.4*  MCV 96.4 96.2  PLT 179 178   Cardiac Enzymes:  Recent Labs Lab 11/02/16 1436 11/02/16 2118 11/03/16 0414 11/03/16 1125  TROPONINI 0.05* 0.05* 0.04* 0.05*   BNP (last 3 results)  Recent Labs  11/02/16 1437  BNP 2,954.0*    ProBNP (last 3 results) No results for input(s): PROBNP in the last 8760 hours.  CBG: No results for input(s): GLUCAP in the last 168 hours.  Recent Results (from the past 240 hour(s))  Urine culture     Status: Abnormal   Collection Time: 11/02/16  2:37 PM  Result Value Ref Range Status   Specimen Description URINE, CLEAN CATCH  Final   Special Requests NONE  Final   Culture MULTIPLE SPECIES PRESENT, SUGGEST RECOLLECTION (A)  Final   Report Status 11/05/2016 FINAL  Final     Studies: No results found.  Scheduled Meds: . atorvastatin  10 mg Oral q1800  . benazepril  10 mg Oral Daily  . clopidogrel  75 mg Oral Daily  . clorazepate  7.5 mg Oral TID  . enoxaparin (LOVENOX) injection  30 mg Subcutaneous Q24H  . fenofibrate  54 mg Oral Daily  . ferrous sulfate  325 mg Oral Daily  . fesoterodine  4 mg Oral Daily  . metoprolol succinate  12.5 mg Oral Daily  . pantoprazole  40 mg Oral Daily  . [START ON 11/07/2016] potassium chloride  20 mEq Oral BID  . Vitamin D (Ergocalciferol)  50,000 Units Oral Q7 days   Continuous Infusions:  Principal Problem:   Acute on chronic combined systolic and diastolic CHF (congestive heart failure) (HCC) Active Problems:   Generalized weakness   Hypokalemia   UTI (urinary tract infection)   Hypertension   Chronic renal insufficiency, stage 3 (moderate)   Anxiety   Hyperlipidemia   GERD (gastroesophageal reflux disease)   Elevated troponin   Anemia   Elevated brain natriuretic peptide (BNP) level   Left carotid bruit   Pulmonary nodules    Time spent: >35 minutes     Esperanza SheetsBURIEV, Shaleta Ruacho N  [facility] Pager [phone]. If 7PM-7AM, please contact  night-coverage at [email or web address], password [email or web address] 11/06/2016, 10:13 AM  LOS: 4 days

## 2016-11-07 DIAGNOSIS — I5043 Acute on chronic combined systolic (congestive) and diastolic (congestive) heart failure: Secondary | ICD-10-CM

## 2016-11-07 LAB — BASIC METABOLIC PANEL
ANION GAP: 8 (ref 5–15)
BUN: 47 mg/dL — ABNORMAL HIGH (ref 6–20)
CHLORIDE: 102 mmol/L (ref 101–111)
CO2: 24 mmol/L (ref 22–32)
Calcium: 9.1 mg/dL (ref 8.9–10.3)
Creatinine, Ser: 1.92 mg/dL — ABNORMAL HIGH (ref 0.44–1.00)
GFR calc non Af Amer: 24 mL/min — ABNORMAL LOW (ref >60)
GFR, EST AFRICAN AMERICAN: 27 mL/min — AB (ref >60)
Glucose, Bld: 100 mg/dL — ABNORMAL HIGH (ref 65–99)
POTASSIUM: 4.9 mmol/L (ref 3.5–5.1)
SODIUM: 134 mmol/L — AB (ref 135–145)

## 2016-11-07 MED ORDER — FUROSEMIDE 20 MG PO TABS: 20.0000 mg | ORAL_TABLET | Freq: Every day | ORAL | 0 refills | Status: DC

## 2016-11-07 MED ORDER — METOPROLOL SUCCINATE ER 25 MG PO TB24: 12.5000 mg | ORAL_TABLET | Freq: Every day | ORAL | 0 refills | Status: DC

## 2016-11-07 MED ORDER — BENAZEPRIL HCL 10 MG PO TABS: 5.0000 mg | ORAL_TABLET | Freq: Every day | ORAL | 0 refills | Status: DC

## 2016-11-07 MED ORDER — ATORVASTATIN CALCIUM 10 MG PO TABS: 10.0000 mg | ORAL_TABLET | Freq: Every day | ORAL | 0 refills | Status: DC

## 2016-11-07 MED ORDER — CEPHALEXIN 500 MG PO CAPS: 500.0000 mg | ORAL_CAPSULE | Freq: Two times a day (BID) | ORAL | 0 refills | Status: AC

## 2016-11-07 NOTE — Discharge Summary (Addendum)
Discharge Summary  Kristy Espinoza WUJ:[id number] DOB: [birth year]  PCP: PROVIDER NOT IN SYSTEM  Admit date: 11/02/2016 Discharge date: 11/07/2016  Time spent: >38mins, from 1pm to 1:40pm  Recommendations for Outpatient Follow-up:  1. F/u with PMD within a week  for hospital discharge follow up, repeat cbc/bmp at follow up 2. F/u with cardiology for chf 3.  f/u with vascular surgery for PVD 4. F/u with nephrology for CKD  Discharge Diagnoses:  Active Hospital Problems   Diagnosis Date Noted  . Acute on chronic combined systolic and diastolic CHF (congestive heart failure) (HCC) 11/04/2016  . Pulmonary nodules 11/03/2016  . Generalized weakness 11/02/2016  . Hypokalemia 11/02/2016  . UTI (urinary tract infection) 11/02/2016  . Hypertension 11/02/2016  . Chronic renal insufficiency, stage 3 (moderate) 11/02/2016  . Anxiety 11/02/2016  . Hyperlipidemia 11/02/2016  . GERD (gastroesophageal reflux disease) 11/02/2016  . Elevated troponin 11/02/2016  . Anemia 11/02/2016  . Elevated brain natriuretic peptide (BNP) level 11/02/2016  . Left carotid bruit 11/02/2016    Resolved Hospital Problems   Diagnosis Date Noted Date Resolved  No resolved problems to display.    Discharge Condition: stable  Diet recommendation: heart healthy  Filed Weights   11/05/16 0500 11/06/16 0458 11/07/16 0537  Weight: 58.1 kg (128 lb 1.6 oz) 59.2 kg (130 lb 9.6 oz) 59.2 kg (130 lb 7.5 oz)    History of present illness:  Patient coming from: Home.  Chief Complaint: Lower extremities swelling.  HPI: Kristy Espinoza is a 83 y.o. female with medical history significant of osteoarthritis, chronic back pain, GERD, hyperlipidemia, hypertension who is coming to the emergency department with progressively worse weakness and lower extremity edema for about a week. She denies chest pain, palpitations, diaphoresis, PND, orthopnea but complains of postural dizziness, dyspnea on exertion and fatigue. Patient  daughter states that they went to urgent care and the patient was prescribed metolazone 5 mg by mouth daily. She denies fever, chills, abdominal pain, nausea, emesis, diarrhea or constipation. She complains of dysuria and frequency.  ED Course: The patient received Rocephin 1 g IVPB, Norco 5/325 one tablet by mouth, furosemide 40 mg IVP, KCl 20 mEq, DuoNeb and earlier to 250 mL bolus by EDP.  Her workup shows EKG with minimal ST depression on inferior leads, WBC 4.8, hemoglobin 10.3 g/dL and platelets 956. Her troponin level was 0.05 ng/mL and her BNP was 2954 pg/dL. Her sodium was 142, potassium 3.4, chloride 107, bicarbonate 25 mmol/L. Her BUN was 40, creatinine 1.56 and glucose 89 mg/dL.  Imaging: Her chest radiograph did not show any acute abnormalities. However, CT of the chest without contrast showed extensive CAD, multiple nodules  Hospital Course:  Principal Problem:   Acute on chronic combined systolic and diastolic CHF (congestive heart failure) (HCC) Active Problems:   Generalized weakness   Hypokalemia   UTI (urinary tract infection)   Hypertension   Chronic renal insufficiency, stage 3 (moderate)   Anxiety   Hyperlipidemia   GERD (gastroesophageal reflux disease)   Elevated troponin   Anemia   Elevated brain natriuretic peptide (BNP) level   Left carotid bruit   Pulmonary nodules   Acute Combined CHF. ECHO with EF 40% and grade 1 diastolic dysfunction.. -received diuresis with lasix, 6.6L negative since admission. Volume status improved.  She is discharged on lasix, decreased dose benazepril, she is started on  toprol XL from this hospitalization -cardiology consulted, she is cleared to discharge home by cardiology and close follow up with cardiology.  Left leg cellulitis.  Several ulcers, erythema, warm, painful to touch. Given concerns for asymmetrical LE edema a venous US was performed that was negative for DVT. -started on ceftriaxone on 2/14>>. She is  discharged on keflex  PVD:  arterial doppler on 2/15:  Right: Resting ankle-brachial index demonstrates moderate range arterial occlusive disease, with the ankle waveforms indicating at least tibial disease of the right lower extremity. If anatomic imaging is warranted to identify target for revascularization, CT angiogram and runoff may be considered.  Left: Resting ankle-brachial index in the mild range of arterial occlusive disease. Waveforms at the ankle suggest tibial disease involving dorsalis pedis, with likely more proximal femoral popliteal disease.  She is already on plavix. She is referred to have  vascular surgery outpatient follow up.   Elevated troponin.  Flat, max troponin 0.05, no chest pain, due to acute CHF. See ECHO results above. No plans for ischemic work up inpatient per cardiology. On plavix  Outpatient cardiology follow up  Acute on chronic kidney disease stage III. Creatinine at baseline is around 1.5. Increased to 1.8with diuresis. Monitor   Chronic normocytic anemia: hgb stable at 10, follow up with pmd  FTT; with mild memory impairment, not oriented to year. Home with home health, she does have supportive family  Code Status: full Family Communication: d/w patient, her daughter over the phone Disposition Plan: home with home healht on 2/15   Consultants:  Cardiology   Procedures:  Echo   Pend doppler artery legs  Antibiotics: Ceftriaxone 2/14>>> 2/15,   Discharge Exam: BP 96/72 (BP Location: Left Arm)   Pulse 75   Temp 97.7 F (36.5 C) (Oral)   Resp 20   Ht 5\' 3"  (1.6 m)   Wt 59.2 kg (130 lb 7.5 oz)   SpO2 100%   BMI 23.11 kg/m   General: frail, NAD, not oriented to year, likely baseline Cardiovascular: RRR Respiratory: diminished, but no wheezing, no rales, no rhonchi Extremity: bilateral lower extremity edema has much improved, skin started to wrinkle, erythema right> left  Discharge Instructions You were  cared for by a hospitalist during your hospital stay. If you have any questions about your discharge medications or the care you received while you were in the hospital after you are discharged, you can call the unit and asked to speak with the hospitalist on call if the hospitalist that took care of you is not available. Once you are discharged, your primary care physician will handle any further medical issues. Please note that NO REFILLS for any discharge medications will be authorized once you are discharged, as it is imperative that you return to your primary care physician (or establish a relationship with a primary care physician if you do not have one) for your aftercare needs so that they can reassess your need for medications and monitor your lab values.  Discharge Instructions    Diet - low sodium heart healthy    Complete by:  As directed    Face-to-face encounter (required for Medicare/Medicaid patients)    Complete by:  As directed    I Dominico Rod certify that this patient is under my care and that I, or a nurse practitioner or physician's assistant working with me, had a face-to-face encounter that meets the physician face-to-face encounter requirements with this patient on 11/07/2016. The encounter with the patient was in whole, or in part for the following medical condition(s) which is the primary reason for home health care (List medical condition): FTT  The encounter with the patient was in whole, or in part, for the following medical condition, which is the primary reason for home health care:  FTT   I certify that, based on my findings, the following services are medically necessary home health services:   Nursing Physical therapy     Reason for Medically Necessary Home Health Services:  Skilled Nursing- Change/Decline in Patient Status   My clinical findings support the need for the above services:  Cognitive impairments, dementia, or mental confusion  that make it unsafe to leave  home   Further, I certify that my clinical findings support that this patient is homebound due to:  Shortness of Breath with activity   Home Health    Complete by:  As directed    To provide the following care/treatments:   PT RN Social work     Increase activity slowly    Complete by:  As directed      Allergies as of 11/07/2016      Reactions   Ativan [lorazepam] Other (See Comments)   Hallucinations   Ciprocin-fluocin-procin [fluocinolone] Diarrhea   Ibuprofen Other (See Comments)   Due to abd surgery   Aspirin Other (See Comments)   Due to abd surgery   Avelox [moxifloxacin Hcl In Nacl] Rash   Tape Rash      Medication List    STOP taking these medications   metolazone 5 MG tablet Commonly known as:  ZAROXOLYN     TAKE these medications   atorvastatin 10 MG tablet Commonly known as:  LIPITOR Take 1 tablet (10 mg total) by mouth daily at 6 PM.   benazepril 10 MG tablet Commonly known as:  LOTENSIN Take 0.5 tablets (5 mg total) by mouth daily. Start taking on:  11/08/2016 What changed:  medication strength  how much to take  when to take this   cephALEXin 500 MG capsule Commonly known as:  KEFLEX Take 1 capsule (500 mg total) by mouth 2 (two) times daily.   clopidogrel 75 MG tablet Commonly known as:  PLAVIX Take 75 mg by mouth daily.   clorazepate 7.5 MG tablet Commonly known as:  TRANXENE Take 7.5 mg by mouth 3 (three) times daily.   fenofibrate micronized 67 MG capsule Commonly known as:  LOFIBRA Take 67 mg by mouth daily before breakfast.   ferrous sulfate 325 (65 FE) MG EC tablet Take 325 mg by mouth daily.   furosemide 20 MG tablet Commonly known as:  LASIX Take 1 tablet (20 mg total) by mouth daily. Start taking on:  11/08/2016   HYDROcodone-acetaminophen 5-325 MG tablet Commonly known as:  NORCO/VICODIN Take 1 tablet by mouth every 6 (six) hours as needed for moderate pain.   metoprolol succinate 25 MG 24 hr tablet Commonly known  as:  TOPROL-XL Take 0.5 tablets (12.5 mg total) by mouth daily. Start taking on:  11/08/2016   tolterodine 4 MG 24 hr capsule Commonly known as:  DETROL LA Take 4 mg by mouth daily.   Vitamin D (Ergocalciferol) 50000 units Caps capsule Commonly known as:  DRISDOL Take 50,000 Units by mouth every 7 (seven) days.            Durable Medical Equipment        Start     Ordered   11/04/16 1144  For home use only DME 3 n 1  Once     11/04/16 1143   11/04/16 1143  For home use only DME Walker rolling  Once    Question:  Patient needs a walker to treat with the following condition  Answer:  Weakness   11/04/16 1143     Allergies  Allergen Reactions  . Ativan [Lorazepam] Other (See Comments)    Hallucinations   . Ciprocin-Fluocin-Procin [Fluocinolone] Diarrhea  . Ibuprofen Other (See Comments)    Due to abd surgery   . Aspirin Other (See Comments)    Due to abd surgery   . Avelox [Moxifloxacin Hcl In Nacl] Rash  . Tape Rash   Follow-up Information    Joni Reining, NP Follow up on 11/12/2016.   Specialties:  Nurse Practitioner, Radiology, Cardiology Why:  at 1:30 pm Contact information: [address] [place] [address] [phone]        f/u with primary care physician Follow up in 2 week(s).   Why:  for hospital discharge follow up, repeat cbc/bmp at follow up. pmd to refer you to a vascular surgeon for peripheral vascular disease on right leg.       please check your blood pressure at home,bring with your to your follow up appointment. Follow up.        [facility], MD Follow up in 3 week(s).   Specialty:  Nephrology Why:  for chronic kidney disease Contact information: [address] [place] [address] [phone]        Lemar Livings, MD Follow up in 3 week(s).   Specialties:  Vascular Surgery, Cardiology Why:  right lower leg peripheral vascular disease Contact information: [address] [place]  [address] [phone]            The results of significant diagnostics from this hospitalization (including imaging, microbiology, ancillary and laboratory) are listed below for reference.    Significant Diagnostic Studies: Dg Chest 2 View  Result Date: 11/02/2016 CLINICAL DATA:  Leg swelling for several days EXAM: CHEST  2 VIEW COMPARISON:  None. FINDINGS: The heart size and mediastinal contours are within normal limits. Both lungs are clear. The visualized skeletal structures are unremarkable. IMPRESSION: No active cardiopulmonary disease. Electronically Signed   By: Elige Ko   On: 11/02/2016 16:39   Ct Chest Wo Contrast  Result Date: 11/02/2016 CLINICAL DATA:  Bilateral leg swelling over the last week. Recent steroid administration. EXAM: CT CHEST WITHOUT CONTRAST TECHNIQUE: Multidetector CT imaging of the chest was performed following the standard protocol without IV contrast. COMPARISON:  Chest radiography same day FINDINGS: Cardiovascular: Heart size is normal. No pericardial fluid. There is extensive coronary artery calcification. There is extensive aortic calcification without evidence of aneurysm or dissection. Proximal brachiocephalic vessels show extensive atherosclerotic calcification. Mediastinum/Nodes: No mediastinal or hilar mass or lymphadenopathy. Lungs/Pleura: No infiltrate, collapse or effusion. There are scattered bilateral pulmonary nodules bilaterally. The largest include a 6 mm nodule in the right upper lobe anteriorly image 52, an 8 mm nodule in the right lower lobe Image 92, and a 4 mm nodule in the left lower lobe image 84. There are numerous other 2-3 mm nodules scattered within both lungs. These are likely to be benign, but do require follow-up. See below. Upper Abdomen: Indeterminate density rounded area at the upper pole of left kidney could represent a cyst or a mass. This measures up to 3.4 cm in diameter. Musculoskeletal: Chronic spinal degenerative changes.  IMPRESSION: Extensive atherosclerotic disease of the coronary arteries, aorta and brachiocephalic branch vessels. Multiple pulmonary nodules as outlined above. These require further follow-up. Non-contrast chest CT at 3-6 months is recommended. If  the nodules are stable at time of repeat CT, then future CT at 18-24 months (from today's scan) is considered optional for low-risk patients, but is recommended for high-risk patients. This recommendation follows the consensus statement: Guidelines for Management of Incidental Pulmonary Nodules Detected on CT Images: From the Fleischner Society 2017; Radiology 2017; 284:228-243. Up to 3.4 cm in diameter rounded entity at the upper pole the left kidney which is of indeterminate density. This could represent a cyst or mass. This could be evaluated by further cross-sectional imaging including ultrasound or CT with contrast. Electronically Signed   By: Paulina Fusi M.D.   On: 11/02/2016 17:47   US Venous Img Lower Unilateral Right  Result Date: 11/04/2016 CLINICAL DATA:  Right lower extremity edema for 1 week EXAM: RIGHT LOWER EXTREMITY VENOUS DUPLEX ULTRASOUND TECHNIQUE: Doppler venous assessment of the right lower extremity deep venous system was performed, including characterization of spectral flow, compressibility, and phasicity. COMPARISON:  None. FINDINGS: There is complete compressibility of the right common femoral, femoral, and popliteal veins. Doppler analysis demonstrates respiratory phasicity and augmentation of flow with calf compression. No obvious superficial vein or calf vein thrombosis. IMPRESSION: No evidence of right lower extremity DVT. Electronically Signed   By: Jolaine Click M.D.   On: 11/04/2016 09:43   US Arterial Seg Single  Result Date: 11/06/2016 CLINICAL DATA:  83 year old female with a history of lower extremity ulcers. Cardiovascular risk factors include smoking, hypertension, hyperlipidemia EXAM: NONINVASIVE PHYSIOLOGIC VASCULAR STUDY OF  BILATERAL LOWER EXTREMITIES TECHNIQUE: Evaluation of both lower extremities was performed at rest, including calculation of ankle-brachial indices, segmental Doppler COMPARISON:  No prior duplex FINDINGS: Right: Resting ankle brachial index:  0.43 Segmental blood pressure: Symmetric upper extremity pressures Doppler: Segmental Doppler of the distal right lower extremity demonstrates monophasic waveform of the posterior tibial artery and dorsalis pedis. Left: Resting ankle brachial index: 0.81 Segmental blood pressure: Symmetric upper extremity pressures. Doppler: Segmental Doppler of the distal left lower extremity demonstrates biphasic waveform of the posterior tibial artery and monophasic waveform of dorsalis pedis. IMPRESSION: Right: Resting ankle-brachial index demonstrates moderate range arterial occlusive disease, with the ankle waveforms indicating at least tibial disease of the right lower extremity. If anatomic imaging is warranted to identify target for revascularization, CT angiogram and runoff may be considered. Left: Resting ankle-brachial index in the mild range of arterial occlusive disease. Waveforms at the ankle suggest tibial disease involving dorsalis pedis, with likely more proximal femoral popliteal disease. Signed, Yvone Neu. Loreta Ave, DO [facility] Radiology Electronically Signed   By: Gilmer Mor D.O.   On: 11/06/2016 16:20    Microbiology: Recent Results (from the past 240 hour(s))  Urine culture     Status: Abnormal   Collection Time: 11/02/16  2:37 PM  Result Value Ref Range Status   Specimen Description URINE, CLEAN CATCH  Final   Special Requests NONE  Final   Culture MULTIPLE SPECIES PRESENT, SUGGEST RECOLLECTION (A)  Final   Report Status 11/05/2016 FINAL  Final     Labs: Basic Metabolic Panel:  Recent Labs Lab 11/02/16 2118 11/03/16 0414 11/04/16 0430 11/05/16 0629 11/06/16 0533 11/07/16 0635  NA  --  141 139 137  136 134*  K  --  3.9 4.6 4.8 5.2* 4.9  CL  --  107 105 103 102 102  CO2  --  26 24 26 26 24   GLUCOSE  --  90 83 93 99 100*  BUN  --  41* 45*  45* 44* 47*  CREATININE  --  1.54* 1.73* 1.81* 1.86* 1.92*  CALCIUM  --  8.6* 8.8* 9.6 9.2 9.1  MG 1.7  --   --   --   --   --    Liver Function Tests:  Recent Labs Lab 11/02/16 1436  AST 34  ALT 21  ALKPHOS 71  BILITOT 0.7  PROT 6.3*  ALBUMIN 3.5   No results for input(s): LIPASE, AMYLASE in the last 168 hours. No results for input(s): AMMONIA in the last 168 hours. CBC:  Recent Labs Lab 11/02/16 1436 11/03/16 0414  WBC 4.8 4.7  NEUTROABS 3.9  --   HGB 10.3* 10.0*  HCT 31.8* 30.4*  MCV 96.4 96.2  PLT 179 178   Cardiac Enzymes:  Recent Labs Lab 11/02/16 1436 11/02/16 2118 11/03/16 0414 11/03/16 1125  TROPONINI 0.05* 0.05* 0.04* 0.05*   BNP: BNP (last 3 results)  Recent Labs  11/02/16 1437  BNP 2,954.0*    ProBNP (last 3 results) No results for input(s): PROBNP in the last 8760 hours.  CBG: No results for input(s): GLUCAP in the last 168 hours.     SignedAlbertine Grates MD, PhD  [facility] 11/07/2016, 2:03 PM

## 2016-11-07 NOTE — Progress Notes (Signed)
All four side rails of patient's bed up at patient's request.

## 2016-11-07 NOTE — Progress Notes (Signed)
Physical Therapy Treatment Patient Details Name: Kristy Espinoza MRN: [id number] DOB: 1934/06/24 Today's Date: 11/07/2016    History of Present Illness 83 y.o. female with medical history significant of osteoarthritis, chronic back pain, GERD, hyperlipidemia, hypertension who is coming to the emergency department with progressively worse weakness and lower extremity edema for about a week. She denies chest pain, palpitations, diaphoresis, PND, orthopnea but complains of postural dizziness, dyspnea on exertion and fatigue. Patient daughter states that they went to urgent care and the patient was prescribed metolazone 5 mg by mouth daily. She denies fever, chills, abdominal pain, nausea, emesis, diarrhea or constipation. She complains of dysuria and frequency.  chest radiograph did not show any acute abnormalities. However, CT of the chest without contrast showed extensive CAD, multiple nodules.    PT Comments    Pt sitting EOB upon entering.  Pt reported eagerness to go home.  Pt with noted redness/blistering in LE's but reports is better than it was and currently doesn't hurt.  Pt asked for assistance to stand, however did not need assistance other than cues for hand placement as tends to pull instead of pushing with UE's to stand.  Pt requested to void upon standing and also requested help with underwear and wiping, however did not offer assistance and patient was able to complete this on her own as well.  Pt ambulated 150 feet with RW with overall good steppage and safety with walker.  Pt does present with forward bent posturing and some SOB towards end of session.  Pt returned to recliner with call bell and phone.  Pt without complaints or complications at end of session.   Follow Up Recommendations  Home health PT;Other (comment) (vascular consult/compression hose if needed for LE's)     Equipment Recommendations  Rolling walker with 5" wheels;3in1 (PT)    Recommendations for Other Services        Precautions / Restrictions Precautions Precautions: Fall Restrictions Weight Bearing Restrictions: No    Mobility  Bed Mobility               General bed mobility comments: Pt was sitting EOB when entered room  Transfers Overall transfer level: Needs assistance Equipment used: Rolling walker (2 wheeled) Transfers: Sit to/from Stand Sit to Stand: Min guard            Ambulation/Gait Ambulation/Gait assistance: Supervision Ambulation Distance (Feet): 150 Feet Assistive device: Rolling walker (2 wheeled) Gait Pattern/deviations: Trunk flexed;Step-to pattern     General Gait Details: overall safe and uses walker appropriately         Cognition Arousal/Alertness: Awake/alert Behavior During Therapy: WFL for tasks assessed/performed Overall Cognitive Status: Within Functional Limits for tasks assessed                             Pertinent Vitals/Pain Pain Assessment: No/denies pain           PT Goals (current goals can now be found in the care plan section) Acute Rehab PT Goals Patient Stated Goal: To go home.  PT Goal Formulation: With patient Progress towards PT goals: Progressing toward goals    Frequency    Min 3X/week      PT Plan Current plan remains appropriate       End of Session Equipment Utilized During Treatment: Gait belt Activity Tolerance: Patient tolerated treatment well Patient left: in chair;with call bell/phone within reach     Time: 4098-1191 PT Time Calculation (min) (  ACUTE ONLY): 30 min  Charges:  $Gait Training: 8-22 mins $Therapeutic Activity: 8-22 mins                     Lurena Nidamy B Lecretia Buczek, PTA/CLT [phone]  11/07/2016, 11:15 AM

## 2016-11-07 NOTE — Care Management Note (Signed)
Case Management Note  Patient Details  Name: Kristy Espinoza MRN: [id number] Date of Birth: 1934-02-17    If discussed at Long Length of Stay Meetings, dates discussed:  11/07/2016  Additional Comments: Patient discharging home today with Home health. CM faxed referral to [facility] for RN, SW, and PT. Patient has received 3 in 1 and RW already. No other CM needs.  Braison Snoke, Chrystine OilerSharley Diane, RN 11/07/2016, 2:08 PM

## 2016-11-07 NOTE — Progress Notes (Signed)
IV Removed, WNL. Pt D/C instructions explained to her and daughter at bedside. Verbalized understanding. Daughter present to take home.

## 2016-11-12 ENCOUNTER — Encounter: Payer: Self-pay | Admitting: Adult Health

## 2016-11-12 ENCOUNTER — Ambulatory Visit (INDEPENDENT_AMBULATORY_CARE_PROVIDER_SITE_OTHER): Payer: Medicare Other | Admitting: Adult Health

## 2016-11-12 VITALS — BP 152/76 | HR 76 | Ht 63.0 in | Wt 133.0 lb

## 2016-11-12 DIAGNOSIS — I739 Peripheral vascular disease, unspecified: Secondary | ICD-10-CM | POA: Diagnosis not present

## 2016-11-12 DIAGNOSIS — I10 Essential (primary) hypertension: Secondary | ICD-10-CM | POA: Diagnosis not present

## 2016-11-12 DIAGNOSIS — I5043 Acute on chronic combined systolic (congestive) and diastolic (congestive) heart failure: Secondary | ICD-10-CM | POA: Diagnosis not present

## 2016-11-12 MED ORDER — METOPROLOL SUCCINATE ER 25 MG PO TB24
12.5000 mg | ORAL_TABLET | Freq: Every day | ORAL | 6 refills | Status: DC
Start: 2016-11-12 — End: 2016-12-24

## 2016-11-12 MED ORDER — CLOPIDOGREL BISULFATE 75 MG PO TABS: 75.0000 mg | ORAL_TABLET | Freq: Every day | ORAL | 6 refills | Status: DC

## 2016-11-12 MED ORDER — BENAZEPRIL HCL 10 MG PO TABS: 5.0000 mg | ORAL_TABLET | Freq: Every day | ORAL | 6 refills | Status: DC

## 2016-11-12 MED ORDER — FUROSEMIDE 20 MG PO TABS: 20.0000 mg | ORAL_TABLET | Freq: Every day | ORAL | 0 refills | Status: DC

## 2016-11-12 MED ORDER — CEPHALEXIN 500 MG PO CAPS: 500.0000 mg | ORAL_CAPSULE | Freq: Two times a day (BID) | ORAL | 0 refills | Status: DC

## 2016-11-12 NOTE — Progress Notes (Signed)
Name: Kristy Espinoza    DOB: Mar 26, 1934  Age: 83 y.o.  MR#: [id number]       PCP:  PROVIDER NOT IN SYSTEM      Insurance: Payor: MEDICARE / Plan: MEDICARE PART A AND B / Product Type: *No Product type* /   CC:   No chief complaint on file.   VS Vitals:   11/12/16 1432  BP: (!) 152/76  Pulse: 76  SpO2: 99%  Weight: 133 lb (60.3 kg)  Height: 5\' 3"  (1.6 m)    Weights Current Weight  11/12/16 133 lb (60.3 kg)  11/07/16 130 lb 7.5 oz (59.2 kg)    Blood Pressure  BP Readings from Last 3 Encounters:  11/12/16 (!) 152/76  11/07/16 96/72     Admit date:  (Not on file) Last encounter with RMR:  Visit date not found   Allergy Ativan [lorazepam]; Ciprocin-fluocin-procin [fluocinolone]; Ibuprofen; Aspirin; Avelox [moxifloxacin hcl in nacl]; and Tape  Current Outpatient Prescriptions  Medication Sig Dispense Refill  . atorvastatin (LIPITOR) 10 MG tablet Take 1 tablet (10 mg total) by mouth daily at 6 PM. 30 tablet 0  . benazepril (LOTENSIN) 10 MG tablet Take 0.5 tablets (5 mg total) by mouth daily. 30 tablet 0  . clopidogrel (PLAVIX) 75 MG tablet Take 75 mg by mouth daily.    . clorazepate (TRANXENE) 7.5 MG tablet Take 7.5 mg by mouth 3 (three) times daily.    . fenofibrate micronized (LOFIBRA) 67 MG capsule Take 67 mg by mouth daily before breakfast.    . ferrous sulfate 325 (65 FE) MG EC tablet Take 325 mg by mouth daily.    . furosemide (LASIX) 20 MG tablet Take 1 tablet (20 mg total) by mouth daily. 30 tablet 0  . HYDROcodone-acetaminophen (NORCO/VICODIN) 5-325 MG tablet Take 1 tablet by mouth every 6 (six) hours as needed for moderate pain.    . metoprolol succinate (TOPROL-XL) 25 MG 24 hr tablet Take 0.5 tablets (12.5 mg total) by mouth daily. 30 tablet 0  . tolterodine (DETROL LA) 4 MG 24 hr capsule Take 4 mg by mouth daily.    . Vitamin D, Ergocalciferol, (DRISDOL) 50000 units CAPS capsule Take 50,000 Units by mouth every 7 (seven) days.     No current facility-administered  medications for this visit.     Discontinued Meds:   There are no discontinued medications.  Patient Active Problem List   Diagnosis Date Noted  . Acute on chronic combined systolic and diastolic CHF (congestive heart failure) (HCC) 11/04/2016  . Pulmonary nodules 11/03/2016  . Generalized weakness 11/02/2016  . Hypokalemia 11/02/2016  . UTI (urinary tract infection) 11/02/2016  . Hypertension 11/02/2016  . Chronic renal insufficiency, stage 3 (moderate) 11/02/2016  . Anxiety 11/02/2016  . Hyperlipidemia 11/02/2016  . GERD (gastroesophageal reflux disease) 11/02/2016  . Elevated troponin 11/02/2016  . Anemia 11/02/2016  . Elevated brain natriuretic peptide (BNP) level 11/02/2016  . Left carotid bruit 11/02/2016    LABS    Component Value Date/Time   NA 134 (L) 11/07/2016 0635   NA 136 11/06/2016 0533   NA 137 11/05/2016 0629   K 4.9 11/07/2016 0635   K 5.2 (H) 11/06/2016 0533   K 4.8 11/05/2016 0629   CL 102 11/07/2016 0635   CL 102 11/06/2016 0533   CL 103 11/05/2016 0629   CO2 24 11/07/2016 0635   CO2 26 11/06/2016 0533   CO2 26 11/05/2016 0629   GLUCOSE 100 (H) 11/07/2016 0635   GLUCOSE  99 11/06/2016 0533   GLUCOSE 93 11/05/2016 0629   BUN 47 (H) 11/07/2016 0635   BUN 44 (H) 11/06/2016 0533   BUN 45 (H) 11/05/2016 0629   CREATININE 1.92 (H) 11/07/2016 0635   CREATININE 1.86 (H) 11/06/2016 0533   CREATININE 1.81 (H) 11/05/2016 0629   CALCIUM 9.1 11/07/2016 0635   CALCIUM 9.2 11/06/2016 0533   CALCIUM 9.6 11/05/2016 0629   GFRNONAA 24 (L) 11/07/2016 0635   GFRNONAA 24 (L) 11/06/2016 0533   GFRNONAA 25 (L) 11/05/2016 0629   GFRAA 27 (L) 11/07/2016 0635   GFRAA 28 (L) 11/06/2016 0533   GFRAA 29 (L) 11/05/2016 0629   CMP     Component Value Date/Time   NA 134 (L) 11/07/2016 0635   K 4.9 11/07/2016 0635   CL 102 11/07/2016 0635   CO2 24 11/07/2016 0635   GLUCOSE 100 (H) 11/07/2016 0635   BUN 47 (H) 11/07/2016 0635   CREATININE 1.92 (H) 11/07/2016 0635    CALCIUM 9.1 11/07/2016 0635   PROT 6.3 (L) 11/02/2016 1436   ALBUMIN 3.5 11/02/2016 1436   AST 34 11/02/2016 1436   ALT 21 11/02/2016 1436   ALKPHOS 71 11/02/2016 1436   BILITOT 0.7 11/02/2016 1436   GFRNONAA 24 (L) 11/07/2016 0635   GFRAA 27 (L) 11/07/2016 0635       Component Value Date/Time   WBC 4.7 11/03/2016 0414   WBC 4.8 11/02/2016 1436   HGB 10.0 (L) 11/03/2016 0414   HGB 10.3 (L) 11/02/2016 1436   HCT 30.4 (L) 11/03/2016 0414   HCT 31.8 (L) 11/02/2016 1436   MCV 96.2 11/03/2016 0414   MCV 96.4 11/02/2016 1436    Lipid Panel     Component Value Date/Time   CHOL 147 11/05/2016 0629   TRIG 217 (H) 11/05/2016 0629   HDL 46 11/05/2016 0629   CHOLHDL 3.2 11/05/2016 0629   VLDL 43 (H) 11/05/2016 0629   LDLCALC 58 11/05/2016 0629    ABG No results found for: PHART, PCO2ART, PO2ART, HCO3, TCO2, ACIDBASEDEF, O2SAT   Lab Results  Component Value Date   TSH 1.174 11/03/2016   BNP (last 3 results)  Recent Labs  11/02/16 1437  BNP 2,954.0*    ProBNP (last 3 results) No results for input(s): PROBNP in the last 8760 hours.  Cardiac Panel (last 3 results) No results for input(s): CKTOTAL, CKMB, TROPONINI, RELINDX in the last 72 hours.  Iron/TIBC/Ferritin/ %Sat No results found for: IRON, TIBC, FERRITIN, IRONPCTSAT   EKG Orders placed or performed during the hospital encounter of 11/02/16  . EKG 12-Lead  . EKG 12-Lead  . 12 lead EKG  . 12 lead EKG  . EKG 12-Lead  . EKG 12-Lead  . EKG     Prior Assessment and Plan Problem List as of 11/12/2016 Reviewed: 11/04/2016 11:06 AM by Chaya Jan, MD     Cardiovascular and Mediastinum   Hypertension   Acute on chronic combined systolic and diastolic CHF (congestive heart failure) (HCC)     Digestive   GERD (gastroesophageal reflux disease)     Genitourinary   UTI (urinary tract infection)   Chronic renal insufficiency, stage 3 (moderate)     Other   Generalized weakness   Hypokalemia    Anxiety   Hyperlipidemia   Elevated troponin   Anemia   Elevated brain natriuretic peptide (BNP) level   Left carotid bruit   Pulmonary nodules       Imaging: Dg Chest 2 View  Result Date:  11/02/2016 CLINICAL DATA:  Leg swelling for several days EXAM: CHEST  2 VIEW COMPARISON:  None. FINDINGS: The heart size and mediastinal contours are within normal limits. Both lungs are clear. The visualized skeletal structures are unremarkable. IMPRESSION: No active cardiopulmonary disease. Electronically Signed   By: Elige KoHetal  Patel   On: 11/02/2016 16:39   Ct Chest Wo Contrast  Result Date: 11/02/2016 CLINICAL DATA:  Bilateral leg swelling over the last week. Recent steroid administration. EXAM: CT CHEST WITHOUT CONTRAST TECHNIQUE: Multidetector CT imaging of the chest was performed following the standard protocol without IV contrast. COMPARISON:  Chest radiography same day FINDINGS: Cardiovascular: Heart size is normal. No pericardial fluid. There is extensive coronary artery calcification. There is extensive aortic calcification without evidence of aneurysm or dissection. Proximal brachiocephalic vessels show extensive atherosclerotic calcification. Mediastinum/Nodes: No mediastinal or hilar mass or lymphadenopathy. Lungs/Pleura: No infiltrate, collapse or effusion. There are scattered bilateral pulmonary nodules bilaterally. The largest include a 6 mm nodule in the right upper lobe anteriorly image 52, an 8 mm nodule in the right lower lobe Image 92, and a 4 mm nodule in the left lower lobe image 84. There are numerous other 2-3 mm nodules scattered within both lungs. These are likely to be benign, but do require follow-up. See below. Upper Abdomen: Indeterminate density rounded area at the upper pole of left kidney could represent a cyst or a mass. This measures up to 3.4 cm in diameter. Musculoskeletal: Chronic spinal degenerative changes. IMPRESSION: Extensive atherosclerotic disease of the coronary  arteries, aorta and brachiocephalic branch vessels. Multiple pulmonary nodules as outlined above. These require further follow-up. Non-contrast chest CT at 3-6 months is recommended. If the nodules are stable at time of repeat CT, then future CT at 18-24 months (from today's scan) is considered optional for low-risk patients, but is recommended for high-risk patients. This recommendation follows the consensus statement: Guidelines for Management of Incidental Pulmonary Nodules Detected on CT Images: From the Fleischner Society 2017; Radiology 2017; 284:228-243. Up to 3.4 cm in diameter rounded entity at the upper pole the left kidney which is of indeterminate density. This could represent a cyst or mass. This could be evaluated by further cross-sectional imaging including ultrasound or CT with contrast. Electronically Signed   By: Paulina FusiMark  Shogry M.D.   On: 11/02/2016 17:47   [place] Venous Img Lower Unilateral Right  Result Date: 11/04/2016 CLINICAL DATA:  Right lower extremity edema for 1 week EXAM: RIGHT LOWER EXTREMITY VENOUS DUPLEX ULTRASOUND TECHNIQUE: Doppler venous assessment of the right lower extremity deep venous system was performed, including characterization of spectral flow, compressibility, and phasicity. COMPARISON:  None. FINDINGS: There is complete compressibility of the right common femoral, femoral, and popliteal veins. Doppler analysis demonstrates respiratory phasicity and augmentation of flow with calf compression. No obvious superficial vein or calf vein thrombosis. IMPRESSION: No evidence of right lower extremity DVT. Electronically Signed   By: Jolaine ClickArthur  Hoss M.D.   On: 11/04/2016 09:43   [place] Arterial Seg Single  Result Date: 11/06/2016 CLINICAL DATA:  83 year old female with a history of lower extremity ulcers. Cardiovascular risk factors include smoking, hypertension, hyperlipidemia EXAM: NONINVASIVE PHYSIOLOGIC VASCULAR STUDY OF BILATERAL LOWER EXTREMITIES TECHNIQUE: Evaluation of both  lower extremities was performed at rest, including calculation of ankle-brachial indices, segmental Doppler COMPARISON:  No prior duplex FINDINGS: Right: Resting ankle brachial index:  0.43 Segmental blood pressure: Symmetric upper extremity pressures Doppler: Segmental Doppler of the distal right lower extremity demonstrates monophasic waveform of the posterior tibial artery  and dorsalis pedis. Left: Resting ankle brachial index: 0.81 Segmental blood pressure: Symmetric upper extremity pressures. Doppler: Segmental Doppler of the distal left lower extremity demonstrates biphasic waveform of the posterior tibial artery and monophasic waveform of dorsalis pedis. IMPRESSION: Right: Resting ankle-brachial index demonstrates moderate range arterial occlusive disease, with the ankle waveforms indicating at least tibial disease of the right lower extremity. If anatomic imaging is warranted to identify target for revascularization, CT angiogram and runoff may be considered. Left: Resting ankle-brachial index in the mild range of arterial occlusive disease. Waveforms at the ankle suggest tibial disease involving dorsalis pedis, with likely more proximal femoral popliteal disease. Signed, Yvone Neu. Loreta Ave, DO [facility] Electronically Signed   By: Gilmer Mor D.O.   On: 11/06/2016 16:20

## 2016-11-12 NOTE — Progress Notes (Signed)
Cardiology Office Note   Date:  11/12/2016   ID:  Kristy Espinoza, DOB 1934/05/10, MRN [id number]  PCP:  PROVIDER NOT IN SYSTEM  Cardiologist: Branch/  Joni Reining, NP   Chief Complaint  Patient presents with  . Congestive Heart Failure  . PAD   History of Present Illness: Kristy Espinoza is a 83 y.o. female who presents for posthospitalization follow-up after admission for acute combined systolic diastolic heart failure, echocardiogram revealing 40% diffuse hypokinesis, grade 1 diastolic dysfunction, with mild MR. The patient was diuresed and sent home on oral Lasix 20 mg daily, with additional 20 mg for weight gain about 3 pounds.   Patient was also started on benazepril and metoprolol. The patient is not found to be a candidate for ischemic testing given poor renal function and is therefore being treated medically. Discharge weight 130 pounds with a blood pressure of 96/72.  She is here with her daughter today and does have some confusion on taking her medications. She does have a blister pack that is provided for her by the pharmacy. The patient denies any shortness of breath dizziness or chest discomfort. She continues to have some mild lower extremity edema with erythema. She has not been seen by vein and vascular as requested on discharge. She is weighing herself daily and avoiding salty foods. One of her daughters comes by and make sure that her medication dispenser is filled, and she has a neighbor that comes by to check on her daily.  Past Medical History:  Diagnosis Date  . Arthritis   . Back pain   . GERD (gastroesophageal reflux disease)   . High cholesterol   . Hypertension     Past Surgical History:  Procedure Laterality Date  . ABDOMINAL HYSTERECTOMY    . ABDOMINAL SURGERY    . CHOLECYSTECTOMY    . HERNIA REPAIR    . KIDNEY STONE SURGERY       Current Outpatient Prescriptions  Medication Sig Dispense Refill  . atorvastatin (LIPITOR) 10 MG tablet Take 1  tablet (10 mg total) by mouth daily at 6 PM. 30 tablet 0  . benazepril (LOTENSIN) 10 MG tablet Take 0.5 tablets (5 mg total) by mouth daily. 30 tablet 0  . clopidogrel (PLAVIX) 75 MG tablet Take 75 mg by mouth daily.    . clorazepate (TRANXENE) 7.5 MG tablet Take 7.5 mg by mouth 3 (three) times daily.    . fenofibrate micronized (LOFIBRA) 67 MG capsule Take 67 mg by mouth daily before breakfast.    . ferrous sulfate 325 (65 FE) MG EC tablet Take 325 mg by mouth daily.    . furosemide (LASIX) 20 MG tablet Take 1 tablet (20 mg total) by mouth daily. 30 tablet 0  . HYDROcodone-acetaminophen (NORCO/VICODIN) 5-325 MG tablet Take 1 tablet by mouth every 6 (six) hours as needed for moderate pain.    . metoprolol succinate (TOPROL-XL) 25 MG 24 hr tablet Take 0.5 tablets (12.5 mg total) by mouth daily. 30 tablet 0  . tolterodine (DETROL LA) 4 MG 24 hr capsule Take 4 mg by mouth daily.    . Vitamin D, Ergocalciferol, (DRISDOL) 50000 units CAPS capsule Take 50,000 Units by mouth every 7 (seven) days.     No current facility-administered medications for this visit.     Allergies:   Ativan [lorazepam]; Ciprocin-fluocin-procin [fluocinolone]; Ibuprofen; Aspirin; Avelox [moxifloxacin hcl in nacl]; and Tape    Social History:  The patient  reports that she quit smoking about 25 years ago.  Her smoking use included Cigarettes. She has a 40.00 pack-year smoking history. She has never used smokeless tobacco. She reports that she does not drink alcohol or use drugs.   Family History:  The patient's family history includes Cancer in her father; Kidney failure in her mother.    ROS: All other systems are reviewed and negative. Unless otherwise mentioned in H&P    PHYSICAL EXAM: VS:  BP (!) 152/76   Pulse 76   Ht 5\' 3"  (1.6 m)   Wt 133 lb (60.3 kg)   SpO2 99%   BMI 23.56 kg/m  , BMI Body mass index is 23.56 kg/m. GEN: Well nourished, well developed, in no acute distress  HEENT: normal  Neck: no JVD,  bilateral carotid bruits, or masses Cardiac: RRR; 1/6 systolic murmur, no ubs, or gallops,no edema  Respiratory:  clear to auscultation bilaterally, normal work of breathing GI: soft, nontender, nondistended, + BS MS: no deformity or atrophy Bilateral lower extremity edema right greater than left, with pain on palpation. Significant erythema is noted, with well Skin: warm and dry, no rash Neuro:  Strength and sensation are intact Psych: euthymic mood, full affect   Recent Labs: 11/02/2016: ALT 21; B Natriuretic Peptide 2,954.0; Magnesium 1.7 11/03/2016: Hemoglobin 10.0; Platelets 178; TSH 1.174 11/07/2016: BUN 47; Creatinine, Ser 1.92; Potassium 4.9; Sodium 134    Lipid Panel    Component Value Date/Time   CHOL 147 11/05/2016 0629   TRIG 217 (H) 11/05/2016 0629   HDL 46 11/05/2016 0629   CHOLHDL 3.2 11/05/2016 0629   VLDL 43 (H) 11/05/2016 0629   LDLCALC 58 11/05/2016 0629      Wt Readings from Last 3 Encounters:  11/12/16 133 lb (60.3 kg)  11/07/16 130 lb 7.5 oz (59.2 kg)      Other studies Reviewed:  Echocardiogram 01\22\2018 Left ventricle: The cavity size was normal. Wall thickness was   increased in a pattern of mild LVH. The estimated ejection   fraction was 40%. Diffuse hypokinesis. Doppler parameters are   consistent with abnormal left ventricular relaxation (grade 1   diastolic dysfunction). - Aortic valve: Mildly calcified annulus. Trileaflet; mildly   calcified leaflets. Moderate calcification involving the right   coronary cusp. - Mitral valve: Calcified annulus. There was mild regurgitation. - Left atrium: The atrium was mildly dilated. - Right atrium: Central venous pressure (est): 3 mm Hg. - Atrial septum: A patent foramen ovale cannot be excluded. - Tricuspid valve: There was trivial regurgitation. - Pulmonary arteries: Systolic pressure could not be accurately   estimated. - Pericardium, extracardiac: There was no pericardial  effusion.   ASSESSMENT AND PLAN:  1.  Chronic mixed CHF: The patient has been compliant with her medications, low-sodium diet and daily weights. Her weight remains consistent at home. She has a daughter and a neighbor that checks on her every day and make sure she takes her medications. She does still have some dependent edema right greater than left in her lower extremities with erythema noted. There is still painful to touch and warm. I'm going to go ahead and refill her Keflex 500 mg twice a day for another 10 days. We will repeat her BMET.  2. Peripheral arterial disease: The patient has not and seen by vein and vascular surgeons yet. A referral is sent again they should be contacting her to arrange appointment. She has had a significant carotid bruit as well.  3. Hypertension: Blood pressure is elevated initially, I rechecked the blood pressure in the  exam room, found to be 128/58. Will not make any changes on her medication regimen at this time. BMET is ordered.   Current medicines are reviewed at length with the patient today.    Labs/ tests ordered today include:  No orders of the defined types were placed in this encounter.    Disposition:   FU with one month to 6 weeks in the [place] office with Dr. Wyline Mood  Signed, Joni Reining, NP  11/12/2016 3:21 PM    [facility] [address] [place], [place] [address] Phone: [phone]; Fax: [phone]

## 2016-11-12 NOTE — Addendum Note (Signed)
Addended by: Marlyn CorporalARLTON, Chenelle Benning A on: 11/12/2016 03:37 PM   Modules accepted: Orders

## 2016-11-12 NOTE — Patient Instructions (Addendum)
Your physician recommends that you schedule a follow-up appointment in: 4-6 weeks in [place] with Dr Wyline MoodBranch   Get lab work before next apt with MD: BMET    Take Keflex 500 mg twice a day for 10 days  I refilled your medications   [facility] [place] [phone], they will call you for an apt.   Thank you for choosing [facility] !

## 2016-11-12 NOTE — Addendum Note (Signed)
Addended by: Marlyn CorporalARLTON, Jefte Carithers A on: 11/12/2016 03:30 PM   Modules accepted: Orders

## 2016-12-03 ENCOUNTER — Telehealth: Payer: Self-pay | Admitting: Cardiology

## 2016-12-03 NOTE — Telephone Encounter (Signed)
[facility] - and says pt hasn't gotten labs done - says she will do BMP this week. Says she will fax orders to sign

## 2016-12-03 NOTE — Telephone Encounter (Signed)
Increase lasix to 40mg  daily for 3 days then resume 20mg  daily. She was to have  BMET done, I dont see the results, can we find out about this please   Dominga FerryJ Chaeli Judy MD

## 2016-12-03 NOTE — Telephone Encounter (Signed)
Pt has gained 3lbs since Thursday --132 on the 8th and 135 today. Takes Lasix 20 mgs 1x  Day per Andee [facility]risty [facility] [place] [facility] Christy can be reached @ [phone]

## 2016-12-11 ENCOUNTER — Telehealth: Payer: Self-pay | Admitting: Cardiology

## 2016-12-11 NOTE — Telephone Encounter (Signed)
[facility] calling in weight on patient. 3/16 130.8 lbs 3/22 132 lbs  Patient is on 20 mg of lasix per day.

## 2016-12-11 NOTE — Telephone Encounter (Signed)
Sent to Dr. Branch as an FYI.  

## 2016-12-12 NOTE — Telephone Encounter (Signed)
With her kidney function I would stat on lasix current dose. A weight range fo 130-135 is accetpable for her    Dominga FerryJ Branch MD

## 2016-12-12 NOTE — Telephone Encounter (Addendum)
[facility] Verna CzechBranch's notes. Christy voiced understanding.

## 2016-12-24 ENCOUNTER — Ambulatory Visit (INDEPENDENT_AMBULATORY_CARE_PROVIDER_SITE_OTHER): Payer: Medicare Other | Admitting: Cardiology

## 2016-12-24 ENCOUNTER — Encounter: Payer: Self-pay | Admitting: Cardiology

## 2016-12-24 VITALS — BP 148/77 | HR 74 | Ht 63.0 in | Wt 133.0 lb

## 2016-12-24 DIAGNOSIS — N183 Chronic kidney disease, stage 3 unspecified: Secondary | ICD-10-CM

## 2016-12-24 DIAGNOSIS — I5042 Chronic combined systolic (congestive) and diastolic (congestive) heart failure: Secondary | ICD-10-CM

## 2016-12-24 DIAGNOSIS — I739 Peripheral vascular disease, unspecified: Secondary | ICD-10-CM | POA: Diagnosis not present

## 2016-12-24 MED ORDER — METOPROLOL SUCCINATE ER 25 MG PO TB24: 25.0000 mg | ORAL_TABLET | Freq: Every day | ORAL | 1 refills | Status: DC

## 2016-12-24 NOTE — Progress Notes (Signed)
Clinical Summary Kristy Espinoza is a 83 y.o.female seen today for follow up of the following medical problems.   1. Chronic combined systolic/diastolic HF -  Echo LVEF 40%, diffuse hypokinesis, grade I diastolic dysfunction, mild MR.  - recent admission 10/2016 with CHF.  - appears plavix started by pcp for primary prevention in setting of aspirin allergy.  - discharge weight 130 lbs, negative 6.6 liters during admission. She had been around 140 on admission.  - weight range 130-133 lbs.  - last Cr 2.02 by labs 12/06/16. Admit Cr 11/02/16 was 1.56.    - no recent edema. Home weights stable 130-133.  - no SOB or DOE  2. PAD - right ABI 0.43, left 0.81 - referrered to vascular according to prior notes.  - no recent leg pains.   3. CKD III    Past Medical History:  Diagnosis Date  . Arthritis   . Back pain   . GERD (gastroesophageal reflux disease)   . High cholesterol   . Hypertension      Allergies  Allergen Reactions  . Ativan [Lorazepam] Other (See Comments)    Hallucinations   . Ciprocin-Fluocin-Procin [Fluocinolone] Diarrhea  . Ibuprofen Other (See Comments)    Due to abd surgery   . Aspirin Other (See Comments)    Due to abd surgery   . Avelox [Moxifloxacin Hcl In Nacl] Rash  . Tape Rash     Current Outpatient Prescriptions  Medication Sig Dispense Refill  . atorvastatin (LIPITOR) 10 MG tablet Take 1 tablet (10 mg total) by mouth daily at 6 PM. 30 tablet 0  . benazepril (LOTENSIN) 10 MG tablet Take 0.5 tablets (5 mg total) by mouth daily. 30 tablet 6  . cephALEXin (KEFLEX) 500 MG capsule Take 1 capsule (500 mg total) by mouth 2 (two) times daily. 20 capsule 0  . clopidogrel (PLAVIX) 75 MG tablet Take 1 tablet (75 mg total) by mouth daily. 30 tablet 6  . clorazepate (TRANXENE) 7.5 MG tablet Take 7.5 mg by mouth 3 (three) times daily.    . fenofibrate micronized (LOFIBRA) 67 MG capsule Take 67 mg by mouth daily before breakfast.    . ferrous sulfate 325  (65 FE) MG EC tablet Take 325 mg by mouth daily.    . furosemide (LASIX) 20 MG tablet Take 1 tablet (20 mg total) by mouth daily. 30 tablet 0  . HYDROcodone-acetaminophen (NORCO/VICODIN) 5-325 MG tablet Take 1 tablet by mouth every 6 (six) hours as needed for moderate pain.    . metoprolol succinate (TOPROL-XL) 25 MG 24 hr tablet Take 0.5 tablets (12.5 mg total) by mouth daily. 30 tablet 6  . tolterodine (DETROL LA) 4 MG 24 hr capsule Take 4 mg by mouth daily.    . Vitamin D, Ergocalciferol, (DRISDOL) 50000 units CAPS capsule Take 50,000 Units by mouth every 7 (seven) days.     No current facility-administered medications for this visit.      Past Surgical History:  Procedure Laterality Date  . ABDOMINAL HYSTERECTOMY    . ABDOMINAL SURGERY    . CHOLECYSTECTOMY    . HERNIA REPAIR    . KIDNEY STONE SURGERY       Allergies  Allergen Reactions  . Ativan [Lorazepam] Other (See Comments)    Hallucinations   . Ciprocin-Fluocin-Procin [Fluocinolone] Diarrhea  . Ibuprofen Other (See Comments)    Due to abd surgery   . Aspirin Other (See Comments)    Due to abd surgery   .  Avelox [Moxifloxacin Hcl In Nacl] Rash  . Tape Rash      Family History  Problem Relation Age of Onset  . Cancer Father   . Kidney failure Mother      Social History Kristy Espinoza reports that she quit smoking about 25 years ago. Her smoking use included Cigarettes. She has a 40.00 pack-year smoking history. She has never used smokeless tobacco. Kristy Espinoza reports that she does not drink alcohol.   Review of Systems CONSTITUTIONAL: No weight loss, fever, chills, weakness or fatigue.  HEENT: Eyes: No visual loss, blurred vision, double vision or yellow sclerae.No hearing loss, sneezing, congestion, runny nose or sore throat.  SKIN: No rash or itching.  CARDIOVASCULAR: per hpi RESPIRATORY: No shortness of breath, cough or sputum.  GASTROINTESTINAL: No anorexia, nausea, vomiting or diarrhea. No  abdominal pain or blood.  GENITOURINARY: No burning on urination, no polyuria NEUROLOGICAL: No headache, dizziness, syncope, paralysis, ataxia, numbness or tingling in the extremities. No change in bowel or bladder control.  MUSCULOSKELETAL: No muscle, back pain, joint pain or stiffness.  LYMPHATICS: No enlarged nodes. No history of splenectomy.  PSYCHIATRIC: No history of depression or anxiety.  ENDOCRINOLOGIC: No reports of sweating, cold or heat intolerance. No polyuria or polydipsia.  Marland Kitchen   Physical Examination Vitals:   12/24/16 1400  BP: (!) 148/77  Pulse: 74   Vitals:   12/24/16 1400  Weight: 133 lb (60.3 kg)  Height:  (1.6 m)    Gen: resting comfortably, no acute distress HEENT: no scleral icterus, pupils equal round and reactive, no palptable cervical adenopathy,  CV: RRR, 2/6 systolic murmur RUSB, no jvd Resp: Clear to auscultation bilaterally GI: abdomen is soft, non-tender, non-distended, normal bowel sounds, no hepatosplenomegaly MSK: extremities are warm, no edema.  Skin: warm, no rash Neuro:  no focal deficits Psych: appropriate affect   Diagnostic Studies 10/2016 ABI FINDINGS: Right:  Resting ankle brachial index:  0.43  Segmental blood pressure: Symmetric upper extremity pressures  Doppler: Segmental Doppler of the distal right lower extremity demonstrates monophasic waveform of the posterior tibial artery and dorsalis pedis.  Left:  Resting ankle brachial index: 0.81  Segmental blood pressure: Symmetric upper extremity pressures.  Doppler: Segmental Doppler of the distal left lower extremity demonstrates biphasic waveform of the posterior tibial artery and monophasic waveform of dorsalis pedis.  IMPRESSION: Right:  Resting ankle-brachial index demonstrates moderate range arterial occlusive disease, with the ankle waveforms indicating at least tibial disease of the right lower extremity. If anatomic imaging is warranted to  identify target for revascularization, CT angiogram and runoff may be considered.  Left:  Resting ankle-brachial index in the mild range of arterial occlusive disease.  Waveforms at the ankle suggest tibial disease involving dorsalis pedis, with likely more proximal femoral popliteal disease.   10/2016 echo Study Conclusions  - Left ventricle: The cavity size was normal. Wall thickness was   increased in a pattern of mild LVH. The estimated ejection   fraction was 40%. Diffuse hypokinesis. Doppler parameters are   consistent with abnormal left ventricular relaxation (grade 1   diastolic dysfunction). - Aortic valve: Mildly calcified annulus. Trileaflet; mildly   calcified leaflets. Moderate calcification involving the right   coronary cusp. - Mitral valve: Calcified annulus. There was mild regurgitation. - Left atrium: The atrium was mildly dilated. - Right atrium: Central venous pressure (est): 3 mm Hg. - Atrial septum: A patent foramen ovale cannot be excluded. - Tricuspid valve: There was trivial regurgitation. - Pulmonary  arteries: Systolic pressure could not be accurately   estimated. - Pericardium, extracardiac: There was no pericardial effusion.  Impressions:  - Mild LVH with LVEF approximately 40%, overall diffuse   hypokinesis. Grade 1 diastolic dysfunction. Mild left atrial   enlargement. Mild mitral regurgitation. Trivial tricuspid   regurgitation. Cannot exclude PFO.   Assessment and Plan  1. Chronic combined systolic/diastolic HF - weights are stable, appears euvolemic - we will increase Toprol XL 25.mg daily.   2. PAD - upcoming appt with vascular  3. CKD III - uptrending Cr, we will repeat labs. May need adjustment in lasix.   F/u 1 month    Antoine Poche, M.D.

## 2016-12-24 NOTE — Patient Instructions (Signed)
Your physician recommends that you schedule a follow-up appointment in: 1 MONTH WITH DR. BRANCH  Your physician has recommended you make the following change in your medication:   INCREASE TOPROL XL 25 MG DAILY  Your physician recommends that you return for lab work in: 2 WEEKS BMP/MG  Thank you for choosing [facility]!!

## 2017-01-17 ENCOUNTER — Encounter (HOSPITAL_COMMUNITY): Payer: Medicare Other

## 2017-01-17 ENCOUNTER — Encounter: Payer: Medicare Other | Admitting: Vascular Surgery

## 2017-01-22 ENCOUNTER — Telehealth: Payer: Self-pay

## 2017-01-23 ENCOUNTER — Ambulatory Visit: Payer: Medicare Other | Admitting: Cardiology

## 2017-01-23 ENCOUNTER — Telehealth: Payer: Self-pay

## 2017-01-23 NOTE — Telephone Encounter (Signed)
Attempted to contact patient regarding lab work that was ordered at last office visit. Patient called to reschedule appointment until the 15 th of May

## 2017-01-23 NOTE — Telephone Encounter (Signed)
Advised patient she needed lab work before her rescheduled apt on May 15th.

## 2017-01-27 ENCOUNTER — Ambulatory Visit: Payer: Medicare Other | Admitting: Cardiology

## 2017-02-04 ENCOUNTER — Ambulatory Visit: Payer: Medicare Other | Admitting: Cardiology

## 2017-02-04 NOTE — Progress Notes (Deleted)
Clinical Summary [name] is a 83 y.o.female seen today for follow up of the following medical problems.   1. Chronic combined systolic/diastolic HF - Echo LVEF 40%, diffuse hypokinesis, grade I diastolic dysfunction, mild MR.  - recent admission 10/2016 with CHF.  - appears plavix started by pcp for primary prevention in setting of aspirin allergy.  - discharge weight 130 lbs, negative 6.6 liters during admission. She had been around 140 on admission.  - weight range 130-133 lbs.  - last Cr 2.02 by labs 12/06/16. Admit Cr 11/02/16 was 1.56.    - no recent edema. Home weights stable 130-133.  - no SOB or DOE  - last visit we increased Toprol XL to 25mg  daily.   2. PAD - right ABI 0.43, left 0.81 - referrered to vascular according to prior notes.  - no recent leg pains.   3. CKD III - uptrend in Cr, repeat labs??    Past Medical History:  Diagnosis Date  . Arthritis   . Back pain   . GERD (gastroesophageal reflux disease)   . High cholesterol   . Hypertension      Allergies  Allergen Reactions  . Ativan [Lorazepam] Other (See Comments)    Hallucinations   . Ciprocin-Fluocin-Procin [Fluocinolone] Diarrhea  . Ibuprofen Other (See Comments)    Due to abd surgery   . Aspirin Other (See Comments)    Due to abd surgery   . Avelox [Moxifloxacin Hcl In Nacl] Rash  . Tape Rash     Current Outpatient Prescriptions  Medication Sig Dispense Refill  . atorvastatin (LIPITOR) 10 MG tablet Take 1 tablet (10 mg total) by mouth daily at 6 PM. 30 tablet 0  . benazepril (LOTENSIN) 10 MG tablet Take 0.5 tablets (5 mg total) by mouth daily. 30 tablet 6  . clopidogrel (PLAVIX) 75 MG tablet Take 1 tablet (75 mg total) by mouth daily. 30 tablet 6  . clorazepate (TRANXENE) 7.5 MG tablet Take 7.5 mg by mouth 3 (three) times daily.    . fenofibrate micronized (LOFIBRA) 67 MG capsule Take 67 mg by mouth daily before breakfast.    . ferrous sulfate 325 (65 FE) MG EC  tablet Take 325 mg by mouth daily.    . furosemide (LASIX) 20 MG tablet Take 1 tablet (20 mg total) by mouth daily. 30 tablet 0  . HYDROcodone-acetaminophen (NORCO/VICODIN) 5-325 MG tablet Take 1 tablet by mouth every 6 (six) hours as needed for moderate pain.    . metoprolol succinate (TOPROL XL) 25 MG 24 hr tablet Take 1 tablet (25 mg total) by mouth daily. 90 tablet 1  . tolterodine (DETROL LA) 4 MG 24 hr capsule Take 4 mg by mouth daily.    . Vitamin D, Ergocalciferol, (DRISDOL) 50000 units CAPS capsule Take 50,000 Units by mouth every 7 (seven) days.     No current facility-administered medications for this visit.      Past Surgical History:  Procedure Laterality Date  . ABDOMINAL HYSTERECTOMY    . ABDOMINAL SURGERY    . CHOLECYSTECTOMY    . HERNIA REPAIR    . KIDNEY STONE SURGERY       Allergies  Allergen Reactions  . Ativan [Lorazepam] Other (See Comments)    Hallucinations   . Ciprocin-Fluocin-Procin [Fluocinolone] Diarrhea  . Ibuprofen Other (See Comments)    Due to abd surgery   . Aspirin Other (See Comments)    Due to abd surgery   . Avelox [Moxifloxacin  Hcl In Nacl] Rash  . Tape Rash      Family History  Problem Relation Age of Onset  . Cancer Father   . Kidney failure Mother      Social History [name] reports that she quit smoking about 25 years ago. Her smoking use included Cigarettes. She has a 40.00 pack-year smoking history. She has never used smokeless tobacco. [name] reports that she does not drink alcohol.   Review of Systems CONSTITUTIONAL: No weight loss, fever, chills, weakness or fatigue.  HEENT: Eyes: No visual loss, blurred vision, double vision or yellow sclerae.No hearing loss, sneezing, congestion, runny nose or sore throat.  SKIN: No rash or itching.  CARDIOVASCULAR:  RESPIRATORY: No shortness of breath, cough or sputum.  GASTROINTESTINAL: No anorexia, nausea, vomiting or diarrhea. No abdominal pain or blood.    GENITOURINARY: No burning on urination, no polyuria NEUROLOGICAL: No headache, dizziness, syncope, paralysis, ataxia, numbness or tingling in the extremities. No change in bowel or bladder control.  MUSCULOSKELETAL: No muscle, back pain, joint pain or stiffness.  LYMPHATICS: No enlarged nodes. No history of splenectomy.  PSYCHIATRIC: No history of depression or anxiety.  ENDOCRINOLOGIC: No reports of sweating, cold or heat intolerance. No polyuria or polydipsia.  Marland Kitchen   Physical Examination There were no vitals filed for this visit. There were no vitals filed for this visit.  Gen: resting comfortably, no acute distress HEENT: no scleral icterus, pupils equal round and reactive, no palptable cervical adenopathy,  CV Resp: Clear to auscultation bilaterally GI: abdomen is soft, non-tender, non-distended, normal bowel sounds, no hepatosplenomegaly MSK: extremities are warm, no edema.  Skin: warm, no rash Neuro:  no focal deficits Psych: appropriate affect   Diagnostic Studies 10/2016 ABI FINDINGS: Right:  Resting ankle brachial index: 0.43  Segmental blood pressure: Symmetric upper extremity pressures  Doppler: Segmental Doppler of the distal right lower extremity demonstrates monophasic waveform of the posterior tibial artery and dorsalis pedis.  Left:  Resting ankle brachial index: 0.81  Segmental blood pressure: Symmetric upper extremity pressures.  Doppler: Segmental Doppler of the distal left lower extremity demonstrates biphasic waveform of the posterior tibial artery and monophasic waveform of dorsalis pedis.  IMPRESSION: Right:  Resting ankle-brachial index demonstrates moderate range arterial occlusive disease, with the ankle waveforms indicating at least tibial disease of the right lower extremity. If anatomic imaging is warranted to identify target for revascularization, CT angiogram and runoff may be considered.  Left:  Resting  ankle-brachial index in the mild range of arterial occlusive disease.  Waveforms at the ankle suggest tibial disease involving dorsalis pedis, with likely more proximal femoral popliteal disease.   10/2016 echo Study Conclusions  - Left ventricle: The cavity size was normal. Wall thickness was increased in a pattern of mild LVH. The estimated ejection fraction was 40%. Diffuse hypokinesis. Doppler parameters are consistent with abnormal left ventricular relaxation (grade 1 diastolic dysfunction). - Aortic valve: Mildly calcified annulus. Trileaflet; mildly calcified leaflets. Moderate calcification involving the right coronary cusp. - Mitral valve: Calcified annulus. There was mild regurgitation. - Left atrium: The atrium was mildly dilated. - Right atrium: Central venous pressure (est): 3 mm Hg. - Atrial septum: A patent foramen ovale cannot be excluded. - Tricuspid valve: There was trivial regurgitation. - Pulmonary arteries: Systolic pressure could not be accurately estimated. - Pericardium, extracardiac: There was no pericardial effusion.  Impressions:  - Mild LVH with LVEF approximately 40%, overall diffuse hypokinesis. Grade 1 diastolic dysfunction. Mild left atrial enlargement. Mild mitral  regurgitation. Trivial tricuspid regurgitation. Cannot exclude PFO.     Assessment and Plan  1. Chronic combined systolic/diastolic HF - weights are stable, appears euvolemic - we will increase Toprol XL 25.mg daily.   2. PAD - upcoming appt with vascular  3. CKD III - uptrending Cr, we will repeat labs. May need adjustment in lasix.   F/u 1 month      Antoine PocheJonathan F. Lesley Galentine, M.D., F.A.C.C.

## 2017-02-05 ENCOUNTER — Encounter: Payer: Self-pay | Admitting: Cardiology

## 2017-03-11 ENCOUNTER — Encounter: Payer: Self-pay | Admitting: Cardiology

## 2017-03-11 ENCOUNTER — Ambulatory Visit (INDEPENDENT_AMBULATORY_CARE_PROVIDER_SITE_OTHER): Payer: Medicare Other | Admitting: Cardiology

## 2017-03-11 ENCOUNTER — Ambulatory Visit: Payer: Medicare Other | Admitting: Cardiology

## 2017-03-11 VITALS — BP 109/67 | HR 77 | Ht 63.0 in | Wt 126.0 lb

## 2017-03-11 DIAGNOSIS — I5042 Chronic combined systolic (congestive) and diastolic (congestive) heart failure: Secondary | ICD-10-CM

## 2017-03-11 DIAGNOSIS — I739 Peripheral vascular disease, unspecified: Secondary | ICD-10-CM | POA: Diagnosis not present

## 2017-03-11 DIAGNOSIS — L03115 Cellulitis of right lower limb: Secondary | ICD-10-CM

## 2017-03-11 DIAGNOSIS — I10 Essential (primary) hypertension: Secondary | ICD-10-CM | POA: Diagnosis not present

## 2017-03-11 MED ORDER — CEPHALEXIN 500 MG PO CAPS: 500.0000 mg | ORAL_CAPSULE | Freq: Two times a day (BID) | ORAL | 0 refills | Status: AC

## 2017-03-11 NOTE — Progress Notes (Signed)
Clinical Summary [name] is a 83 y.o.female seen today for follow up of the following medical problems.   1. Chronic combined systolic/diastolic HF - Echo LVEF 40%, diffuse hypokinesis, grade I diastolic dysfunction, mild MR.  - recent admission 10/2016 with CHF.  - appears plavix started by pcp for primary prevention in setting of aspirin allergy.  - discharge weight 130 lbs, negative 6.6 liters during admission. She had been around 140 on admission.  - weight range 130-133 lbs.  - last Cr 2.02 by labs 12/06/16. Admit Cr 11/02/16 was 1.56.     - right leg swelling  over the last few days, has had some erythema in that area. Similar presentation to her recent issues with cellulitis in that leg.    2. PAD - right ABI 0.43, left 0.81 - referrered to vascular according to prior notes, she cancelled appointment - occasional pain right leg, bumped her toe recenly, has a slow healing sore on that foot.    3. CKD III    Past Medical History:  Diagnosis Date  . Arthritis   . Back pain   . GERD (gastroesophageal reflux disease)   . High cholesterol   . Hypertension      Allergies  Allergen Reactions  . Ativan [Lorazepam] Other (See Comments)    Hallucinations   . Ciprocin-Fluocin-Procin [Fluocinolone] Diarrhea  . Ibuprofen Other (See Comments)    Due to abd surgery   . Aspirin Other (See Comments)    Due to abd surgery   . Avelox [Moxifloxacin Hcl In Nacl] Rash  . Tape Rash     Current Outpatient Prescriptions  Medication Sig Dispense Refill  . atorvastatin (LIPITOR) 10 MG tablet Take 1 tablet (10 mg total) by mouth daily at 6 PM. 30 tablet 0  . benazepril (LOTENSIN) 10 MG tablet Take 0.5 tablets (5 mg total) by mouth daily. 30 tablet 6  . clopidogrel (PLAVIX) 75 MG tablet Take 1 tablet (75 mg total) by mouth daily. 30 tablet 6  . clorazepate (TRANXENE) 7.5 MG tablet Take 7.5 mg by mouth 3 (three) times daily.    . fenofibrate micronized (LOFIBRA) 67  MG capsule Take 67 mg by mouth daily before breakfast.    . ferrous sulfate 325 (65 FE) MG EC tablet Take 325 mg by mouth daily.    . furosemide (LASIX) 20 MG tablet Take 1 tablet (20 mg total) by mouth daily. 30 tablet 0  . HYDROcodone-acetaminophen (NORCO/VICODIN) 5-325 MG tablet Take 1 tablet by mouth every 6 (six) hours as needed for moderate pain.    . metoprolol succinate (TOPROL XL) 25 MG 24 hr tablet Take 1 tablet (25 mg total) by mouth daily. 90 tablet 1  . tolterodine (DETROL LA) 4 MG 24 hr capsule Take 4 mg by mouth daily.    . Vitamin D, Ergocalciferol, (DRISDOL) 50000 units CAPS capsule Take 50,000 Units by mouth every 7 (seven) days.     No current facility-administered medications for this visit.      Past Surgical History:  Procedure Laterality Date  . ABDOMINAL HYSTERECTOMY    . ABDOMINAL SURGERY    . CHOLECYSTECTOMY    . HERNIA REPAIR    . KIDNEY STONE SURGERY       Allergies  Allergen Reactions  . Ativan [Lorazepam] Other (See Comments)    Hallucinations   . Ciprocin-Fluocin-Procin [Fluocinolone] Diarrhea  . Ibuprofen Other (See Comments)    Due to abd surgery   . Aspirin Other (  See Comments)    Due to abd surgery   . Avelox [Moxifloxacin Hcl In Nacl] Rash  . Tape Rash      Family History  Problem Relation Age of Onset  . Cancer Father   . Kidney failure Mother      Social History [name] reports that she quit smoking about 25 years ago. Her smoking use included Cigarettes. She has a 40.00 pack-year smoking history. She has never used smokeless tobacco. [name] reports that she does not drink alcohol.   Review of Systems CONSTITUTIONAL: No weight loss, fever, chills, weakness or fatigue.  HEENT: Eyes: No visual loss, blurred vision, double vision or yellow sclerae.No hearing loss, sneezing, congestion, runny nose or sore throat.  SKIN: No rash or itching.  CARDIOVASCULAR: per hpi RESPIRATORY: No shortness of breath, cough or  sputum.  GASTROINTESTINAL: No anorexia, nausea, vomiting or diarrhea. No abdominal pain or blood.  GENITOURINARY: No burning on urination, no polyuria NEUROLOGICAL: No headache, dizziness, syncope, paralysis, ataxia, numbness or tingling in the extremities. No change in bowel or bladder control.  MUSCULOSKELETAL: No muscle, back pain, joint pain or stiffness.  LYMPHATICS: No enlarged nodes. No history of splenectomy.  PSYCHIATRIC: No history of depression or anxiety.  ENDOCRINOLOGIC: No reports of sweating, cold or heat intolerance. No polyuria or polydipsia.  Marland Kitchen   Physical Examination Vitals:   03/11/17 1555  BP: 109/67  Pulse: 77   Vitals:   03/11/17 1555  Weight: 126 lb (57.2 kg)  Height: 5\' 3"  (1.6 m)    Gen: resting comfortably, no acute distress HEENT: no scleral icterus, pupils equal round and reactive, no palptable cervical adenopathy,  CV: RRR, no m/r/g, no jvd Resp: Clear to auscultation bilaterally GI: abdomen is soft, non-tender, non-distended, normal bowel sounds, no hepatosplenomegaly MSK: extremities are warm, no edema.  Skin: warm, no rash Neuro:  no focal deficits Psych: appropriate affect   Diagnostic Studies  10/2016 ABI FINDINGS: Right:  Resting ankle brachial index: 0.43  Segmental blood pressure: Symmetric upper extremity pressures  Doppler: Segmental Doppler of the distal right lower extremity demonstrates monophasic waveform of the posterior tibial artery and dorsalis pedis.  Left:  Resting ankle brachial index: 0.81  Segmental blood pressure: Symmetric upper extremity pressures.  Doppler: Segmental Doppler of the distal left lower extremity demonstrates biphasic waveform of the posterior tibial artery and monophasic waveform of dorsalis pedis.  IMPRESSION: Right:  Resting ankle-brachial index demonstrates moderate range arterial occlusive disease, with the ankle waveforms indicating at least tibial disease of the right  lower extremity. If anatomic imaging is warranted to identify target for revascularization, CT angiogram and runoff may be considered.  Left:  Resting ankle-brachial index in the mild range of arterial occlusive disease.  Waveforms at the ankle suggest tibial disease involving dorsalis pedis, with likely more proximal femoral popliteal disease.   10/2016 echo Study Conclusions  - Left ventricle: The cavity size was normal. Wall thickness was increased in a pattern of mild LVH. The estimated ejection fraction was 40%. Diffuse hypokinesis. Doppler parameters are consistent with abnormal left ventricular relaxation (grade 1 diastolic dysfunction). - Aortic valve: Mildly calcified annulus. Trileaflet; mildly calcified leaflets. Moderate calcification involving the right coronary cusp. - Mitral valve: Calcified annulus. There was mild regurgitation. - Left atrium: The atrium was mildly dilated. - Right atrium: Central venous pressure (est): 3 mm Hg. - Atrial septum: A patent foramen ovale cannot be excluded. - Tricuspid valve: There was trivial regurgitation. - Pulmonary arteries: Systolic pressure  could not be accurately estimated. - Pericardium, extracardiac: There was no pericardial effusion.  Impressions:  - Mild LVH with LVEF approximately 40%, overall diffuse hypokinesis. Grade 1 diastolic dysfunction. Mild left atrial enlargement. Mild mitral regurgitation. Trivial tricuspid regurgitation. Cannot exclude PFO.     Assessment and Plan  1. Chronic combined systolic/diastolic HF - weights are down from last visit. Recent right leg swelling likely due to surrounding cellulitis, possible venous insufficiency. She does not have evidence of total body fluid overload - continue current cardiac meds.   2. PAD - we will refer again to vascular, she cancelled last appt - right leg pain and erythema, low ABI around 0.4. Slow healing sore on foot.    - start kelfex 500mg  bid x 7 days for possible cellulitis.   3. CKD III - recheck labs  F/u 3 months      Antoine Poche, M.D., F.A.C.C.

## 2017-03-11 NOTE — Patient Instructions (Signed)
Medication Instructions:   Begin Keflex 500mg  twice a day.  Continue all other medications.    Labwork:  BMET, Magnesium - orders given today.    Patient to go now.   Office will contact with results via phone or letter.    Testing/Procedures: none  Follow-Up: 3 months   Any Other Special Instructions Will Be Listed Below (If Applicable). You have been referred to:  [facility]  If you need a refill on your cardiac medications before your next appointment, please call your pharmacy.

## 2017-03-13 ENCOUNTER — Other Ambulatory Visit: Payer: Self-pay

## 2017-03-13 DIAGNOSIS — I739 Peripheral vascular disease, unspecified: Secondary | ICD-10-CM

## 2017-03-14 ENCOUNTER — Encounter: Payer: Self-pay | Admitting: Surgery

## 2017-03-17 ENCOUNTER — Ambulatory Visit (HOSPITAL_COMMUNITY)
Admission: RE | Admit: 2017-03-17 | Discharge: 2017-03-17 | Disposition: A | Discharge status: Home / Self Care | Payer: Medicare Other | Source: Ambulatory Visit | Attending: Vascular Surgery | Admitting: Vascular Surgery

## 2017-03-17 ENCOUNTER — Ambulatory Visit (INDEPENDENT_AMBULATORY_CARE_PROVIDER_SITE_OTHER): Payer: Medicare Other | Admitting: Surgery

## 2017-03-17 ENCOUNTER — Encounter: Payer: Self-pay | Admitting: Surgery

## 2017-03-17 VITALS — BP 163/71 | HR 97 | Temp 98.1°F | Resp 20 | Ht 63.0 in | Wt 125.4 lb

## 2017-03-17 DIAGNOSIS — R0989 Other specified symptoms and signs involving the circulatory and respiratory systems: Secondary | ICD-10-CM | POA: Diagnosis present

## 2017-03-17 DIAGNOSIS — I771 Stricture of artery: Secondary | ICD-10-CM | POA: Diagnosis not present

## 2017-03-17 DIAGNOSIS — Z95828 Presence of other vascular implants and grafts: Secondary | ICD-10-CM

## 2017-03-17 DIAGNOSIS — I70238 Atherosclerosis of native arteries of right leg with ulceration of other part of lower right leg: Secondary | ICD-10-CM | POA: Diagnosis not present

## 2017-03-17 DIAGNOSIS — I739 Peripheral vascular disease, unspecified: Secondary | ICD-10-CM | POA: Diagnosis not present

## 2017-03-17 LAB — VAS US LOWER EXTREMITY ARTERIAL DUPLEX
LATIBDISTSYS: 89 cm/s
LPTIBDISTSYS: -85 cm/s
Left super femoral dist sys PSV: -106 cm/s
Left super femoral mid sys PSV: 149 cm/s
Left super femoral prox sys PSV: -141 cm/s
RATIBDISTSYS: 45 cm/s
RPERPSV: 108 cm/s
RSFMPSV: -149 cm/s
RTIBDISTSYS: 39 cm/s
Right super femoral dist sys PSV: -171 cm/s
Right super femoral prox sys PSV: 144 cm/s

## 2017-03-17 NOTE — Progress Notes (Signed)
[facility] [place]  Patient name: Kristy Espinoza MRN: [id number] DOB: 1934-09-17 Sex: female   REQUESTING PROVIDER:    Dr. Wyline Mood   REASON FOR CONSULT:    Right toe ulcer   HISTORY OF PRESENT ILLNESS:   Kristy Espinoza is a 83 y.o. female, who is Referred today for evaluation of a right second toe wound.  The patient states that she bumped it on the door approximately 2 weeks ago.  She has a known history of vascular insufficiency to that leg.  She was started on antibiotics.  The patient has a history of heart failure.  Her last echo showed a ejection fraction of 40%.  She takes Plavix for primary prevention, as she has an aspirin allergy.  She has known chronic renal insufficiency with a creatinine most recently 2.02.  She also has bilateral leg swelling.  She takes a statin for hypercholesterolemia.  She is a former smoker.  PAST MEDICAL HISTORY    Past Medical History:  Diagnosis Date  . Arthritis   . Back pain   . GERD (gastroesophageal reflux disease)   . High cholesterol   . Hypertension      FAMILY HISTORY   Family History  Problem Relation Age of Onset  . Cancer Father   . Kidney failure Mother     SOCIAL HISTORY:   Social History   Social History  . Marital status: Widowed    Spouse name: N/A  . Number of children: N/A  . Years of education: N/A   Occupational History  . Not on file.   Social History Main Topics  . Smoking status: Former Smoker    Packs/day: 1.00    Years: 40.00    Types: Cigarettes    Quit date: 09/24/1991  . Smokeless tobacco: Never Used  . Alcohol use No  . Drug use: No  . Sexual activity: Not on file   Other Topics Concern  . Not on file   Social History Narrative  . No narrative on file    ALLERGIES:    Allergies  Allergen Reactions  . Ativan [Lorazepam] Other (See Comments)    Hallucinations   . Ciprocin-Fluocin-Procin [Fluocinolone] Diarrhea  .  Ibuprofen Other (See Comments)    Due to abd surgery   . Aspirin Other (See Comments)    Due to abd surgery   . Avelox [Moxifloxacin Hcl In Nacl] Rash  . Tape Rash    CURRENT MEDICATIONS:    Current Outpatient Prescriptions  Medication Sig Dispense Refill  . atorvastatin (LIPITOR) 10 MG tablet Take 1 tablet (10 mg total) by mouth daily at 6 PM. 30 tablet 0  . benazepril (LOTENSIN) 10 MG tablet Take 0.5 tablets (5 mg total) by mouth daily. 30 tablet 6  . cephALEXin (KEFLEX) 500 MG capsule Take 1 capsule (500 mg total) by mouth 2 (two) times daily. 14 capsule 0  . clopidogrel (PLAVIX) 75 MG tablet Take 1 tablet (75 mg total) by mouth daily. 30 tablet 6  . clorazepate (TRANXENE) 7.5 MG tablet Take 7.5 mg by mouth 3 (three) times daily.    . fenofibrate micronized (LOFIBRA) 67 MG capsule Take 67 mg by mouth daily before breakfast.    . ferrous sulfate 325 (65 FE) MG EC tablet Take 325 mg by mouth daily.    . furosemide (LASIX) 20 MG tablet Take 1 tablet (20 mg total) by mouth daily. 30 tablet 0  . HYDROcodone-acetaminophen (NORCO/VICODIN) 5-325 MG tablet Take 1 tablet  by mouth every 6 (six) hours as needed for moderate pain.    . metoprolol succinate (TOPROL XL) 25 MG 24 hr tablet Take 1 tablet (25 mg total) by mouth daily. 90 tablet 1  . tolterodine (DETROL LA) 4 MG 24 hr capsule Take 4 mg by mouth daily.    . Vitamin D, Ergocalciferol, (DRISDOL) 50000 units CAPS capsule Take 50,000 Units by mouth every 7 (seven) days.     No current facility-administered medications for this visit.     REVIEW OF SYSTEMS:   [X]  denotes positive finding, [ ]  denotes negative finding Cardiac  Comments:  Chest pain or chest pressure:    Shortness of breath upon exertion:    Short of breath when lying flat:    Irregular heart rhythm:        Vascular    Pain in calf, thigh, or hip brought on by ambulation: x   Pain in feet at night that wakes you up from your sleep:  x   Blood clot in your veins: x    Leg swelling:  x       Pulmonary    Oxygen at home:    Productive cough:     Wheezing:         Neurologic    Sudden weakness in arms or legs:     Sudden numbness in arms or legs:     Sudden onset of difficulty speaking or slurred speech:    Temporary loss of vision in one eye:     Problems with dizziness:         Gastrointestinal    Blood in stool:      Vomited blood:         Genitourinary    Burning when urinating:     Blood in urine:        Psychiatric    Major depression:         Hematologic    Bleeding problems:    Problems with blood clotting too easily:        Skin    Rashes or ulcers:        Constitutional    Fever or chills:     PHYSICAL EXAM:   Vitals:   03/17/17 1008 03/17/17 1015  BP: (!) 152/63 (!) 163/71  Pulse: 97   Resp: 20   Temp: 98.1 F (36.7 C)   TempSrc: Oral   SpO2: 98%   Weight: 125 lb 6.4 oz (56.9 kg)   Height: 5\' 3"  (1.6 m)     GENERAL: The patient is a well-nourished female, in no acute distress. The vital signs are documented aboe CARDIAC: There is a regular rate and rhythm.  VASCULAR: Nonpalpable pedal pulse on the right PULMONARY: Nonlabored respirations MUSCULOSKELETAL: There are no major deformities or cyanosis. NEUROLOGIC: No focal weakness or paresthesias are detected. SKIN: Superficial 3 mm eschar on right second toe with surrounding erythema.  There is edema around the second toe. PSYCHIATRIC: The patient has a normal affect.  STUDIES:   I have reviewed her vascular lab studies.  The ABI 0.43 on the right and 0.81 on the left.  Duplex shows 50-74% common femoral artery stenosis.  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  ASSESSMENT and PLAN   Right second toe wound with infection in the setting of arterial insufficiency: I discussed with the patient and her family that this is a limb threatening situation.  I have recommended that we proceed with angiography to better define  her anatomy.  This will need to be  through a left femoral approach.  She will likely need CO2 for the abdominal portion of the exam with limiting use of contrast secondary to her renal insufficiency.  This is been scheduled for Tuesday, July 3.  She will continue oral antibiotics in the meantime.  If the infection appears to start to spread, the next step would be admission for IV antibiotics followed by angiography.  All this was discussed with the patient and her family.  They understand the risks and benefits of the arteriogram including the risk of bleeding as well as the risk for distal embolization and subsequent surgery.  All their questions were answered.   Durene Cal, MD [facility] [phone] Pager [phone]

## 2017-03-19 ENCOUNTER — Other Ambulatory Visit: Payer: Self-pay

## 2017-03-20 ENCOUNTER — Telehealth: Payer: Self-pay

## 2017-03-20 DIAGNOSIS — L98499 Non-pressure chronic ulcer of skin of other sites with unspecified severity: Secondary | ICD-10-CM

## 2017-03-20 DIAGNOSIS — L97519 Non-pressure chronic ulcer of other part of right foot with unspecified severity: Secondary | ICD-10-CM

## 2017-03-20 DIAGNOSIS — L089 Local infection of the skin and subcutaneous tissue, unspecified: Secondary | ICD-10-CM

## 2017-03-20 MED ORDER — CEPHALEXIN 500 MG PO CAPS: 500.0000 mg | ORAL_CAPSULE | Freq: Two times a day (BID) | ORAL | 0 refills | Status: DC

## 2017-03-20 NOTE — Telephone Encounter (Signed)
-----   Message from Nada LibmanVance W Brabham, MD sent at 03/19/2017  9:48 PM EDT ----- Regarding: RE: Refill antibiotic? Yes, refill it please.  Thanks ----- Message ----- From: Phillips OdorPullins, Boen Sterbenz S, RN Sent: 03/19/2017  10:27 AM To: Nada LibmanVance W Brabham, MD Subject: Refill antibiotic?                             Spoke with pt's daughter; question about refilling antibiotic that was originally ordered by Dr. Wyline MoodBranch for cellulitis of her right leg.  Her angiogram is scheduled for 7/3. Do you want to give her a refill on the Keflex 500 mg BID?

## 2017-03-20 NOTE — Telephone Encounter (Signed)
Phone call to pt's. daughter, Agustin CreeDarlene.  Informed that Dr. Myra GianottiBrabham has requested that the pt. get one refill on her antibiotic.  Advised will send Rx electronically to pt's pharmacy for Cephalexin 500 mg BID for 7 days.  Daughter verb. understanding/ agreed.

## 2017-03-25 ENCOUNTER — Ambulatory Visit (HOSPITAL_COMMUNITY)
Admission: RE | Admit: 2017-03-25 | Discharge: 2017-03-25 | Disposition: A | Discharge status: Home / Self Care | Payer: Medicare Other | Source: Ambulatory Visit | Attending: Surgery | Admitting: Surgery

## 2017-03-25 ENCOUNTER — Encounter (HOSPITAL_COMMUNITY)
Admission: RE | Disposition: A | Discharge status: Home / Self Care | Payer: Self-pay | Source: Ambulatory Visit | Attending: Surgery

## 2017-03-25 DIAGNOSIS — N189 Chronic kidney disease, unspecified: Secondary | ICD-10-CM | POA: Diagnosis not present

## 2017-03-25 DIAGNOSIS — E7800 Pure hypercholesterolemia, unspecified: Secondary | ICD-10-CM | POA: Insufficient documentation

## 2017-03-25 DIAGNOSIS — K219 Gastro-esophageal reflux disease without esophagitis: Secondary | ICD-10-CM | POA: Diagnosis not present

## 2017-03-25 DIAGNOSIS — I130 Hypertensive heart and chronic kidney disease with heart failure and stage 1 through stage 4 chronic kidney disease, or unspecified chronic kidney disease: Secondary | ICD-10-CM | POA: Insufficient documentation

## 2017-03-25 DIAGNOSIS — Z7902 Long term (current) use of antithrombotics/antiplatelets: Secondary | ICD-10-CM | POA: Insufficient documentation

## 2017-03-25 DIAGNOSIS — I70235 Atherosclerosis of native arteries of right leg with ulceration of other part of foot: Secondary | ICD-10-CM | POA: Insufficient documentation

## 2017-03-25 DIAGNOSIS — L97519 Non-pressure chronic ulcer of other part of right foot with unspecified severity: Secondary | ICD-10-CM | POA: Insufficient documentation

## 2017-03-25 DIAGNOSIS — I739 Peripheral vascular disease, unspecified: Secondary | ICD-10-CM | POA: Diagnosis present

## 2017-03-25 DIAGNOSIS — Z87891 Personal history of nicotine dependence: Secondary | ICD-10-CM | POA: Diagnosis not present

## 2017-03-25 DIAGNOSIS — M1990 Unspecified osteoarthritis, unspecified site: Secondary | ICD-10-CM | POA: Insufficient documentation

## 2017-03-25 DIAGNOSIS — I509 Heart failure, unspecified: Secondary | ICD-10-CM | POA: Insufficient documentation

## 2017-03-25 HISTORY — PX: ABDOMINAL AORTOGRAM W/LOWER EXTREMITY: CATH118223

## 2017-03-25 HISTORY — PX: PERIPHERAL VASCULAR BALLOON ANGIOPLASTY: CATH118281

## 2017-03-25 LAB — POCT ACTIVATED CLOTTING TIME
ACTIVATED CLOTTING TIME: 257 s
ACTIVATED CLOTTING TIME: 219 s
ACTIVATED CLOTTING TIME: 175 s

## 2017-03-25 LAB — POCT I-STAT, CHEM 8
BUN: 49 mg/dL — AB (ref 6–20)
CHLORIDE: 109 mmol/L (ref 101–111)
Calcium, Ion: 1.47 mmol/L — ABNORMAL HIGH (ref 1.15–1.40)
Creatinine, Ser: 1.7 mg/dL — ABNORMAL HIGH (ref 0.44–1.00)
Glucose, Bld: 88 mg/dL (ref 65–99)
HCT: 31 % — ABNORMAL LOW (ref 36.0–46.0)
Hemoglobin: 10.5 g/dL — ABNORMAL LOW (ref 12.0–15.0)
POTASSIUM: 4.1 mmol/L (ref 3.5–5.1)
SODIUM: 144 mmol/L (ref 135–145)
TCO2: 25 mmol/L (ref 0–100)

## 2017-03-25 SURGERY — ABDOMINAL AORTOGRAM W/LOWER EXTREMITY
Anesthesia: LOCAL | Laterality: Right

## 2017-03-25 MED ORDER — METOPROLOL TARTRATE 5 MG/5ML IV SOLN: 2.0000 mg | INTRAVENOUS | Status: DC | PRN

## 2017-03-25 MED ORDER — MIDAZOLAM HCL 2 MG/2ML IJ SOLN
INTRAMUSCULAR | Status: DC | PRN
  Administered 2017-03-25: 1 mg via INTRAVENOUS

## 2017-03-25 MED ORDER — MORPHINE SULFATE (PF) 2 MG/ML IV SOLN: 2.0000 mg | INTRAVENOUS | Status: DC

## 2017-03-25 MED ORDER — FENTANYL CITRATE (PF) 100 MCG/2ML IJ SOLN
INTRAMUSCULAR | Status: AC
  Filled 2017-03-25: qty 2

## 2017-03-25 MED ORDER — SODIUM CHLORIDE 0.9 % IV SOLN
1.0000 mL/kg/h | INTRAVENOUS | Status: DC
Start: 2017-03-25 — End: 2017-03-25

## 2017-03-25 MED ORDER — HEPARIN SODIUM (PORCINE) 1000 UNIT/ML IJ SOLN
INTRAMUSCULAR | Status: DC | PRN
  Administered 2017-03-25: 6000 [IU] via INTRAVENOUS

## 2017-03-25 MED ORDER — MORPHINE SULFATE (PF) 4 MG/ML IV SOLN
INTRAVENOUS | Status: AC
Start: 2017-03-25 — End: 2017-03-25
  Administered 2017-03-25: 2 mg
  Filled 2017-03-25: qty 1

## 2017-03-25 MED ORDER — ACETAMINOPHEN 325 MG PO TABS: 325.0000 mg | ORAL_TABLET | ORAL | Status: DC | PRN

## 2017-03-25 MED ORDER — LIDOCAINE HCL (PF) 1 % IJ SOLN
INTRAMUSCULAR | Status: DC | PRN
  Administered 2017-03-25: 15 mL

## 2017-03-25 MED ORDER — HYDRALAZINE HCL 20 MG/ML IJ SOLN: 5.0000 mg | INTRAMUSCULAR | Status: DC | PRN

## 2017-03-25 MED ORDER — GUAIFENESIN-DM 100-10 MG/5ML PO SYRP: 15.0000 mL | ORAL_SOLUTION | ORAL | Status: DC | PRN

## 2017-03-25 MED ORDER — PHENOL 1.4 % MT LIQD: 1.0000 | OROMUCOSAL | Status: DC | PRN

## 2017-03-25 MED ORDER — LABETALOL HCL 5 MG/ML IV SOLN: 10.0000 mg | INTRAVENOUS | Status: DC | PRN

## 2017-03-25 MED ORDER — MORPHINE SULFATE (PF) 10 MG/ML IV SOLN: 2.0000 mg | INTRAVENOUS | Status: DC | PRN

## 2017-03-25 MED ORDER — ONDANSETRON HCL 4 MG/2ML IJ SOLN: 4.0000 mg | Freq: Four times a day (QID) | INTRAMUSCULAR | Status: DC | PRN

## 2017-03-25 MED ORDER — HEPARIN SODIUM (PORCINE) 1000 UNIT/ML IJ SOLN
INTRAMUSCULAR | Status: AC
  Filled 2017-03-25: qty 1

## 2017-03-25 MED ORDER — LIDOCAINE HCL (PF) 1 % IJ SOLN
INTRAMUSCULAR | Status: AC
  Filled 2017-03-25: qty 30

## 2017-03-25 MED ORDER — OXYCODONE HCL 5 MG PO TABS
5.0000 mg | ORAL_TABLET | ORAL | Status: DC | PRN
  Administered 2017-03-25: 5 mg via ORAL

## 2017-03-25 MED ORDER — HEPARIN (PORCINE) IN NACL 2-0.9 UNIT/ML-% IJ SOLN
INTRAMUSCULAR | Status: AC
  Filled 2017-03-25: qty 500

## 2017-03-25 MED ORDER — ALUM & MAG HYDROXIDE-SIMETH 200-200-20 MG/5ML PO SUSP: 15.0000 mL | ORAL | Status: DC | PRN

## 2017-03-25 MED ORDER — FENTANYL CITRATE (PF) 100 MCG/2ML IJ SOLN
INTRAMUSCULAR | Status: DC | PRN
  Administered 2017-03-25: 25 ug via INTRAVENOUS

## 2017-03-25 MED ORDER — MIDAZOLAM HCL 2 MG/2ML IJ SOLN
INTRAMUSCULAR | Status: AC
Start: 2017-03-25 — End: 2017-03-25
  Filled 2017-03-25: qty 2

## 2017-03-25 MED ORDER — IODIXANOL 320 MG/ML IV SOLN
INTRAVENOUS | Status: DC | PRN
  Administered 2017-03-25: 60 mL via INTRA_ARTERIAL

## 2017-03-25 MED ORDER — SODIUM CHLORIDE 0.9 % IV SOLN: 500.0000 mL | Freq: Once | INTRAVENOUS | Status: DC | PRN

## 2017-03-25 MED ORDER — OXYCODONE HCL 5 MG PO TABS
ORAL_TABLET | ORAL | Status: AC
  Administered 2017-03-25: 5 mg via ORAL
  Filled 2017-03-25: qty 1

## 2017-03-25 MED ORDER — ACETAMINOPHEN 325 MG RE SUPP: 325.0000 mg | RECTAL | Status: DC | PRN

## 2017-03-25 MED ORDER — DOCUSATE SODIUM 100 MG PO CAPS: 100.0000 mg | ORAL_CAPSULE | Freq: Every day | ORAL | Status: DC

## 2017-03-25 MED ORDER — SODIUM CHLORIDE 0.9 % IV SOLN
INTRAVENOUS | Status: DC
  Administered 2017-03-25: 10:00:00 via INTRAVENOUS

## 2017-03-25 SURGICAL SUPPLY — 21 items
BALLN COYOTE OTW 3X100X150 (BALLOONS) ×3
BALLOON COYOTE OTW 3X100X150 (BALLOONS) ×2 IMPLANT
CATH OMNI FLUSH 5F 65CM (CATHETERS) ×3 IMPLANT
CATH QUICKCROSS .018X135CM (MICROCATHETER) ×3 IMPLANT
COVER DOME SNAP 22 D (MISCELLANEOUS) ×6 IMPLANT
FILTER CO2 0.2 MICRON (VASCULAR PRODUCTS) ×6 IMPLANT
GLIDEWIRE ANGLED NITR .018X260 (WIRE) ×3 IMPLANT
GUIDEWIRE STR TIP .014X300X8 (WIRE) ×3 IMPLANT
KIT ENCORE 26 ADVANTAGE (KITS) ×3 IMPLANT
KIT MICROINTRODUCER STIFF 5F (SHEATH) ×3 IMPLANT
KIT PV (KITS) ×3 IMPLANT
RESERVOIR CO2 (VASCULAR PRODUCTS) ×3 IMPLANT
SET FLUSH CO2 (MISCELLANEOUS) ×3 IMPLANT
SHEATH PINNACLE 5F 10CM (SHEATH) ×3 IMPLANT
SHEATH PINNACLE ST 6F 45CM (SHEATH) ×3 IMPLANT
SYR MEDRAD MARK V 150ML (SYRINGE) ×3 IMPLANT
TAPE RADIOPAQUE TURBO (MISCELLANEOUS) ×3 IMPLANT
TRANSDUCER W/STOPCOCK (MISCELLANEOUS) ×3 IMPLANT
TRAY PV CATH (CUSTOM PROCEDURE TRAY) ×3 IMPLANT
WIRE BENTSON .035X145CM (WIRE) ×6 IMPLANT
WIRE G V18X300CM (WIRE) ×3 IMPLANT

## 2017-03-25 NOTE — Op Note (Signed)
Patient name: Kristy Espinoza MRN: [id number] DOB: December 15, 1933 Sex: female  03/25/2017 Pre-operative Diagnosis: right leg ulcer Post-operative diagnosis:  Same Surgeon:  Durene Cal Procedure Performed:  1.  Ultrasound-guided access, left femoral artery  2.  Abdominal aortogram with CO2  3.  Right lower extremity runoff  4.  Additional order catheterization  5.  Angioplasty right peroneal artery  6.  Angioplasty right tibioperoneal trunk  7.  Conscious sedation (88 minutes)   Indications:  The patient has a nonhealing wound on her right toe.  She comes in today for further evaluation as ultrasound showed vascular disease.  Procedure:  The patient was identified in the holding area and taken to room 8.  The patient was then placed supine on the table and prepped and draped in the usual sterile fashion.  A time out was called.  Conscious sedation was administered with the use of IV fentanyl and Versed under continuous physician and nurse monitoring.  Heart rate, blood pressure, and oxygen saturation were continuous monitored.  Ultrasound was used to evaluate the left common femoral artery.  It was patent .  A digital ultrasound image was acquired.  A micropuncture needle was used to access the left common femoral artery under ultrasound guidance.  An 018 wire was advanced without resistance and a micropuncture sheath was placed.  The 018 wire was removed and a benson wire was placed.  The micropuncture sheath was exchanged for a 5 french sheath.  An omniflush catheter was advanced over the wire to the level of L-1.  An abdominal angiogram was obtained.  Next, using the omniflush catheter and a benson wire, the aortic bifurcation was crossed and the catheter was placed into theright external iliac artery and right runoff was obtained.   Findings:   Aortogram:  The renal arteries were not very well distinguished with CO2 however the left renal was widely patent.  A wire went easily into the right  renal.  The infrarenal abdominal aorta appears patent without significant stenosis.  Bilateral common and external iliac arteries appear patent without significant stenosis.  Right Lower Extremity:  The right common femoral profunda femoral and superficial femoral artery are patent throughout their course.  There is a early takeoff of the anterior tibial artery above the knee.  The anterior tibial artery occludes shortly thereafter.  The tibioperoneal trunk has mild luminal narrowing.  There is single vessel runoff via the peroneal artery, however there is a short focal occlusion of the peroneal artery.  Left Lower Extremity:  Not evaluated  Intervention:  After the above images were acquired the decisions were made to proceed with intervention.  A 6 French 45 cm sheath was advanced into the right external iliac artery.  The patient was fully heparinized.  Using a V-18 wire and a 018 quick cross catheter.  I ended up crossing the lesion with a V 14 wire.  The quick cross cath was advanced into the peroneal artery and a contrast injection was performed confirming successful crossing of the lesion.  I then selected a 3 x 100 coyote balloon and perform balloon and plasty of the peroneal artery and the tibioperoneal trunk.  Completion imaging revealed excellent result with no residual stenosis.  I reinserted a 3 x 100 balloon and performed balloon and plasty the tibioperoneal trunk up to the origin of the anterior tibial artery.  A final arteriogram was then performed which showed excellent line flow through the peroneal artery.  The the sheath was brought  back to the left external iliac artery.  Wires and catheters were removed.  The patient taken altogether for sheath pull once her platelets profile corrects.  Impression:  #1  short segment occlusion of the peroneal artery successfully crossed and treated with a 3 x 100 balloon.  I treated the tibioperoneal trunk from its origin down into the peroneal  artery.  #2  a total of 60 cc of contrast was utilized.   Juleen ChinaV. Wells Earley Grobe, M.D. [facility] [place] Office: [phone] Pager:  [phone]

## 2017-03-25 NOTE — Progress Notes (Signed)
Pt c/o 9/10 pain in left foot/toe.  Dr Myra GianottiBrabham notified give morphine 2mg  IV now. Will continue to monitor.

## 2017-03-25 NOTE — Discharge Instructions (Signed)

## 2017-03-25 NOTE — H&P (View-Only) (Signed)
 [facility] [place]  Patient name: Kristy Espinoza MRN: [id number] DOB: 02/27/1934 Sex: female   REQUESTING PROVIDER:    Dr. Branch   REASON FOR CONSULT:    Right toe ulcer   HISTORY OF PRESENT ILLNESS:   Kristy Espinoza is a 83 y.o. female, who is Referred today for evaluation of a right second toe wound.  The patient states that she bumped it on the door approximately 2 weeks ago.  She has a known history of vascular insufficiency to that leg.  She was started on antibiotics.  The patient has a history of heart failure.  Her last echo showed a ejection fraction of 40%.  She takes Plavix for primary prevention, as she has an aspirin allergy.  She has known chronic renal insufficiency with a creatinine most recently 2.02.  She also has bilateral leg swelling.  She takes a statin for hypercholesterolemia.  She is a former smoker.  PAST MEDICAL HISTORY    Past Medical History:  Diagnosis Date  . Arthritis   . Back pain   . GERD (gastroesophageal reflux disease)   . High cholesterol   . Hypertension      FAMILY HISTORY   Family History  Problem Relation Age of Onset  . Cancer Father   . Kidney failure Mother     SOCIAL HISTORY:   Social History   Social History  . Marital status: Widowed    Spouse name: N/A  . Number of children: N/A  . Years of education: N/A   Occupational History  . Not on file.   Social History Main Topics  . Smoking status: Former Smoker    Packs/day: 1.00    Years: 40.00    Types: Cigarettes    Quit date: 09/24/1991  . Smokeless tobacco: Never Used  . Alcohol use No  . Drug use: No  . Sexual activity: Not on file   Other Topics Concern  . Not on file   Social History Narrative  . No narrative on file    ALLERGIES:    Allergies  Allergen Reactions  . Ativan [Lorazepam] Other (See Comments)    Hallucinations   . Ciprocin-Fluocin-Procin [Fluocinolone] Diarrhea  .  Ibuprofen Other (See Comments)    Due to abd surgery   . Aspirin Other (See Comments)    Due to abd surgery   . Avelox [Moxifloxacin Hcl In Nacl] Rash  . Tape Rash    CURRENT MEDICATIONS:    Current Outpatient Prescriptions  Medication Sig Dispense Refill  . atorvastatin (LIPITOR) 10 MG tablet Take 1 tablet (10 mg total) by mouth daily at 6 PM. 30 tablet 0  . benazepril (LOTENSIN) 10 MG tablet Take 0.5 tablets (5 mg total) by mouth daily. 30 tablet 6  . cephALEXin (KEFLEX) 500 MG capsule Take 1 capsule (500 mg total) by mouth 2 (two) times daily. 14 capsule 0  . clopidogrel (PLAVIX) 75 MG tablet Take 1 tablet (75 mg total) by mouth daily. 30 tablet 6  . clorazepate (TRANXENE) 7.5 MG tablet Take 7.5 mg by mouth 3 (three) times daily.    . fenofibrate micronized (LOFIBRA) 67 MG capsule Take 67 mg by mouth daily before breakfast.    . ferrous sulfate 325 (65 FE) MG EC tablet Take 325 mg by mouth daily.    . furosemide (LASIX) 20 MG tablet Take 1 tablet (20 mg total) by mouth daily. 30 tablet 0  . HYDROcodone-acetaminophen (NORCO/VICODIN) 5-325 MG tablet Take 1 tablet   by mouth every 6 (six) hours as needed for moderate pain.    . metoprolol succinate (TOPROL XL) 25 MG 24 hr tablet Take 1 tablet (25 mg total) by mouth daily. 90 tablet 1  . tolterodine (DETROL LA) 4 MG 24 hr capsule Take 4 mg by mouth daily.    . Vitamin D, Ergocalciferol, (DRISDOL) 50000 units CAPS capsule Take 50,000 Units by mouth every 7 (seven) days.     No current facility-administered medications for this visit.     REVIEW OF SYSTEMS:   [X] denotes positive finding, [ ] denotes negative finding Cardiac  Comments:  Chest pain or chest pressure:    Shortness of breath upon exertion:    Short of breath when lying flat:    Irregular heart rhythm:        Vascular    Pain in calf, thigh, or hip brought on by ambulation: x   Pain in feet at night that wakes you up from your sleep:  x   Blood clot in your veins: x    Leg swelling:  x       Pulmonary    Oxygen at home:    Productive cough:     Wheezing:         Neurologic    Sudden weakness in arms or legs:     Sudden numbness in arms or legs:     Sudden onset of difficulty speaking or slurred speech:    Temporary loss of vision in one eye:     Problems with dizziness:         Gastrointestinal    Blood in stool:      Vomited blood:         Genitourinary    Burning when urinating:     Blood in urine:        Psychiatric    Major depression:         Hematologic    Bleeding problems:    Problems with blood clotting too easily:        Skin    Rashes or ulcers:        Constitutional    Fever or chills:     PHYSICAL EXAM:   Vitals:   03/17/17 1008 03/17/17 1015  BP: (!) 152/63 (!) 163/71  Pulse: 97   Resp: 20   Temp: 98.1 F (36.7 C)   TempSrc: Oral   SpO2: 98%   Weight: 125 lb 6.4 oz (56.9 kg)   Height: 5' 3" (1.6 m)     GENERAL: The patient is a well-nourished female, in no acute distress. The vital signs are documented aboe CARDIAC: There is a regular rate and rhythm.  VASCULAR: Nonpalpable pedal pulse on the right PULMONARY: Nonlabored respirations MUSCULOSKELETAL: There are no major deformities or cyanosis. NEUROLOGIC: No focal weakness or paresthesias are detected. SKIN: Superficial 3 mm eschar on right second toe with surrounding erythema.  There is edema around the second toe. PSYCHIATRIC: The patient has a normal affect.  STUDIES:   I have reviewed her vascular lab studies.  The ABI 0.43 on the right and 0.81 on the left.  Duplex shows 50-74% common femoral artery stenosis.  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  ASSESSMENT and PLAN   Right second toe wound with infection in the setting of arterial insufficiency: I discussed with the patient and her family that this is a limb threatening situation.  I have recommended that we proceed with angiography to better define   her anatomy.  This will need to be  through a left femoral approach.  She will likely need CO2 for the abdominal portion of the exam with limiting use of contrast secondary to her renal insufficiency.  This is been scheduled for Tuesday, July 3.  She will continue oral antibiotics in the meantime.  If the infection appears to start to spread, the next step would be admission for IV antibiotics followed by angiography.  All this was discussed with the patient and her family.  They understand the risks and benefits of the arteriogram including the risk of bleeding as well as the risk for distal embolization and subsequent surgery.  All their questions were answered.   Wells Tavian Callander, MD [facility] [place] Tel [phone] Pager [phone] 

## 2017-03-25 NOTE — Interval H&P Note (Signed)
History and Physical Interval Note:  03/25/2017 10:42 AM  Kristy SaxonMartha Guadamuz  has presented today for surgery, with the diagnosis of pvd with right toe ulcer  The various methods of treatment have been discussed with the patient and family. After consideration of risks, benefits and other options for treatment, the patient has consented to  Procedure(s): Abdominal Aortogram w/Lower Extremity (N/A) as a surgical intervention .  The patient's history has been reviewed, patient examined, no change in status, stable for surgery.  I have reviewed the patient's chart and labs.  Questions were answered to the patient's satisfaction.     Durene CalBrabham, Wells

## 2017-03-25 NOTE — Progress Notes (Signed)
Site area: left groin fa sheath Site Prior to Removal:  Level 0 Pressure Applied For: 35 minutes Manual:   yes Patient Status During Pull:  stable Post Pull Site:  Level 0,bruising around site Post Pull Instructions Given:  yes Post Pull Pulses Present: dopplered Dressing Applied:  Gauze and tegaderm Bedrest begins @ 1450 Comments:

## 2017-03-27 ENCOUNTER — Encounter (HOSPITAL_COMMUNITY): Payer: Self-pay | Admitting: Surgery

## 2017-03-28 ENCOUNTER — Telehealth: Payer: Self-pay | Admitting: Surgery

## 2017-03-28 NOTE — Telephone Encounter (Signed)
-----   Message from Sharee PimpleMarilyn K McChesney, RN sent at 03/25/2017  1:58 PM EDT ----- Regarding: 1 month w/ labs   ----- Message ----- From: Nada LibmanBrabham, Vance W, MD Sent: 03/25/2017   1:10 PM To: [facility] Charge Pool  03/25/2017:  Surgeon:  Durene CalBrabham, Wells Procedure Performed:  1.  Ultrasound-guided access, left femoral artery  2.  Abdominal aortogram with CO2  3.  Right lower extremity runoff  4.  Additional order catheterization  5.  Angioplasty right peroneal artery  6.  Angioplasty right tibioperoneal trunk  7.  Conscious sedation (88 minutes)   Follow-up with me in one month with ABIs and duplex

## 2017-03-28 NOTE — Telephone Encounter (Signed)
Spoke to daughter to sch appt with lab and MD in one day 05/12/17; labs at 3:00 and MD at 4:00.

## 2017-03-31 ENCOUNTER — Encounter (HOSPITAL_COMMUNITY): Payer: Medicare Other

## 2017-04-02 ENCOUNTER — Encounter: Payer: Medicare Other | Admitting: Vascular Surgery

## 2017-04-04 ENCOUNTER — Telehealth: Payer: Self-pay

## 2017-04-04 DIAGNOSIS — L98499 Non-pressure chronic ulcer of skin of other sites with unspecified severity: Secondary | ICD-10-CM

## 2017-04-04 DIAGNOSIS — L089 Local infection of the skin and subcutaneous tissue, unspecified: Secondary | ICD-10-CM

## 2017-04-04 DIAGNOSIS — L97519 Non-pressure chronic ulcer of other part of right foot with unspecified severity: Secondary | ICD-10-CM

## 2017-04-04 MED ORDER — CEPHALEXIN 500 MG PO CAPS: 500.0000 mg | ORAL_CAPSULE | Freq: Two times a day (BID) | ORAL | 0 refills | Status: DC

## 2017-04-04 NOTE — Telephone Encounter (Signed)
Phone call from pt's daughter.  Reported the right 2nd toe wound looks worse.  Stated there is increased redness, pain, and swelling.  Denied any fever or chills.  Stated a cell phone was dropped on the affected toe about 7 days ago, and over the past several days, her symptoms have worsened.  The daughter stated that prior to the injury, the toe was looking better.  Also stated the pt. Had been on 2 rounds of antibiotic, and finished the 2nd round about a week ago.  Discussed with Charisse MarchSuzanne Nickel, NP.  Medical record reviewed.  Advised to order Cephalexin 500 mg bid x 10 days; no refills.  Also advised to encourage pt. To elevate the right foot above level of the heart, at intervals, and to start a probiotic either through eating yogurt or an OTC oral capsule.  Phone call returned to the pt's daughter.  Advised that the antibiotic will be refilled again, and of all above recommendations from the NP.  Encouraged to call office is symptoms don't improve.  Verb. Understanding.

## 2017-04-16 ENCOUNTER — Encounter: Payer: Medicare Other | Admitting: Surgery

## 2017-04-16 ENCOUNTER — Encounter (HOSPITAL_COMMUNITY): Payer: Medicare Other

## 2017-04-28 ENCOUNTER — Encounter (HOSPITAL_COMMUNITY): Payer: Medicare Other

## 2017-04-30 ENCOUNTER — Encounter: Payer: Self-pay | Admitting: Surgery

## 2017-05-02 NOTE — Addendum Note (Signed)
Addended by: Burton ApleyPETTY, Sakeenah Valcarcel A on: 05/02/2017 03:10 PM   Modules accepted: Orders

## 2017-05-05 ENCOUNTER — Ambulatory Visit: Payer: Medicare Other | Admitting: Surgery

## 2017-05-12 ENCOUNTER — Ambulatory Visit (HOSPITAL_COMMUNITY): Admission: RE | Admit: 2017-05-12 | Payer: Medicare Other | Source: Ambulatory Visit

## 2017-05-12 ENCOUNTER — Ambulatory Visit (HOSPITAL_COMMUNITY)
Admission: RE | Admit: 2017-05-12 | Discharge: 2017-05-12 | Disposition: A | Discharge status: Home / Self Care | Payer: Medicare Other | Source: Ambulatory Visit | Attending: Surgery | Admitting: Surgery

## 2017-05-12 ENCOUNTER — Ambulatory Visit (INDEPENDENT_AMBULATORY_CARE_PROVIDER_SITE_OTHER): Payer: Medicare Other | Admitting: Surgery

## 2017-05-12 VITALS — BP 170/78 | HR 72 | Ht 63.0 in | Wt 126.8 lb

## 2017-05-12 DIAGNOSIS — L97519 Non-pressure chronic ulcer of other part of right foot with unspecified severity: Secondary | ICD-10-CM

## 2017-05-12 DIAGNOSIS — Z95828 Presence of other vascular implants and grafts: Secondary | ICD-10-CM

## 2017-05-12 DIAGNOSIS — I70238 Atherosclerosis of native arteries of right leg with ulceration of other part of lower right leg: Secondary | ICD-10-CM | POA: Diagnosis not present

## 2017-05-12 LAB — VAS US LOWER EXTREMITY ARTERIAL DUPLEX
RIGHT ANT DIST TIBAL SYS PSV: 43 cm/s
RIGHT POST TIB DIST SYS: 55 cm/s
RSFMPSV: -173 cm/s
RSFPPSV: 145 cm/s
Right peroneal sys PSV: 120 cm/s
Right super femoral dist sys PSV: -124 cm/s

## 2017-05-12 MED ORDER — SULFAMETHOXAZOLE-TRIMETHOPRIM 400-80 MG PO TABS: 1.0000 | ORAL_TABLET | Freq: Two times a day (BID) | ORAL | 0 refills | Status: DC

## 2017-05-12 NOTE — Progress Notes (Signed)
[facility] [place]  Patient name: Kristy Espinoza MRN: [id number] DOB: 19-Sep-1934 Sex: female   REASON FOR VISIT:    Follow up  HISOTRY OF PRESENT ILLNESS:    Kristy Espinoza is a 83 y.o. female who I initially evaluated in June 2018 for a right second toe wound that had been present for approximately 2 weeks.  She had been on antibiotics as an outpatient.  She has known vascular insufficiency of that leg.  She underwent angiography at which time an occluded tibioperoneal trunk was identified.  I was able to successfully recanalized that and treated with angioplasty of the tibioperoneal trunk and peroneal artery.  She is back today for follow-up visit.  The toe has essentially unchanged, however she has fallen and bumped it on occasion.  The patient has a history of heart failure.  Her last echo showed a ejection fraction of 40%.  She takes Plavix for primary prevention, as she has an aspirin allergy.  She has known chronic renal insufficiency with a creatinine most recently 2.02.  She also has bilateral leg swelling.  She takes a statin for hypercholesterolemia.  She is a former smoker.    PAST MEDICAL HISTORY:   Past Medical History:  Diagnosis Date  . Arthritis   . Back pain   . GERD (gastroesophageal reflux disease)   . High cholesterol   . Hypertension      FAMILY HISTORY:   Family History  Problem Relation Age of Onset  . Cancer Father   . Kidney failure Mother     SOCIAL HISTORY:   Social History  Substance Use Topics  . Smoking status: Former Smoker    Packs/day: 1.00    Years: 40.00    Types: Cigarettes    Quit date: 09/24/1991  . Smokeless tobacco: Never Used  . Alcohol use No     ALLERGIES:   Allergies  Allergen Reactions  . Ativan [Lorazepam] Other (See Comments)    Hallucinations   . Ciprocin-Fluocin-Procin [Fluocinolone] Diarrhea  . Ibuprofen Other (See Comments)    Due to abd  surgery   . Versed [Midazolam] Other (See Comments)  . Aspirin Other (See Comments)    Due to abd surgery   . Avelox [Moxifloxacin Hcl In Nacl] Rash  . Tape Rash     CURRENT MEDICATIONS:   Current Outpatient Prescriptions  Medication Sig Dispense Refill  . atorvastatin (LIPITOR) 10 MG tablet Take 1 tablet (10 mg total) by mouth daily at 6 PM. 30 tablet 0  . benazepril (LOTENSIN) 10 MG tablet Take 0.5 tablets (5 mg total) by mouth daily. 30 tablet 6  . busPIRone (BUSPAR) 10 MG tablet Take 10 mg by mouth 3 (three) times daily.    . clopidogrel (PLAVIX) 75 MG tablet Take 1 tablet (75 mg total) by mouth daily. 30 tablet 6  . clorazepate (TRANXENE) 7.5 MG tablet Take 7.5 mg by mouth daily.     . fenofibrate micronized (LOFIBRA) 67 MG capsule Take 67 mg by mouth daily before breakfast.    . ferrous sulfate 325 (65 FE) MG EC tablet Take 325 mg by mouth daily.    . furosemide (LASIX) 20 MG tablet Take 1 tablet (20 mg total) by mouth daily. 30 tablet 0  . HYDROcodone-acetaminophen (NORCO/VICODIN) 5-325 MG tablet Take 1 tablet by mouth every 6 (six) hours as needed for moderate pain.    . metoprolol succinate (TOPROL XL) 25 MG 24 hr tablet Take 1 tablet (25 mg total)  by mouth daily. 90 tablet 1  . tolterodine (DETROL LA) 4 MG 24 hr capsule Take 4 mg by mouth daily.    . Vitamin D, Ergocalciferol, (DRISDOL) 50000 units CAPS capsule Take 50,000 Units by mouth every 7 (seven) days.    Marland Kitchen acetaminophen (TYLENOL) 500 MG tablet Take 1,000 mg by mouth every 8 (eight) hours as needed for mild pain or moderate pain.    Marland Kitchen sulfamethoxazole-trimethoprim (BACTRIM,SEPTRA) 400-80 MG tablet Take 1 tablet by mouth 2 (two) times daily. 20 tablet 0   No current facility-administered medications for this visit.     REVIEW OF SYSTEMS:   [X]  denotes positive finding, [ ]  denotes negative finding Cardiac  Comments:  Chest pain or chest pressure:    Shortness of breath upon exertion:    Short of breath when  lying flat:    Irregular heart rhythm:        Vascular    Pain in calf, thigh, or hip brought on by ambulation:    Pain in feet at night that wakes you up from your sleep:     Blood clot in your veins:    Leg swelling:  x       Pulmonary    Oxygen at home:    Productive cough:     Wheezing:         Neurologic    Sudden weakness in arms or legs:     Sudden numbness in arms or legs:     Sudden onset of difficulty speaking or slurred speech:    Temporary loss of vision in one eye:     Problems with dizziness:         Gastrointestinal    Blood in stool:     Vomited blood:         Genitourinary    Burning when urinating:     Blood in urine:        Psychiatric    Major depression:         Hematologic    Bleeding problems:    Problems with blood clotting too easily:        Skin    Rashes or ulcers: x       Constitutional    Fever or chills:      PHYSICAL EXAM:   Vitals:   05/12/17 1606  BP: (!) 170/78  Pulse: 72  SpO2: 99%  Weight: 126 lb 12.8 oz (57.5 kg)  Height: 5\' 3"  (1.6 m)    GENERAL: The patient is a well-nourished female, in no acute distress. The vital signs are documented above. CARDIAC: There is a regular rate and rhythm.  VASCULAR: Nonpalpable pedal pulses PULMONARY: Non-labored respirations MUSCULOSKELETAL: There are no major deformities or cyanosis. NEUROLOGIC: No focal weakness or paresthesias are detected. SKIN: Roughly 2 mm wound on the top of the right second toe with surrounding edema of the toe as well as blanching erythema extending onto the dorsum of the foot PSYCHIATRIC: The patient has a normal affect.  STUDIES:   I have reviewed her arterial duplex today which shows a widely patent tibioperoneal trunk and peroneal artery, the site of the previous intervention  MEDICAL ISSUES:   Atherosclerosis with ulcer of the right second toe: The patient's wound is been there for approximately 2 months without significant improvement.  She has  undergone angiography and an occluded tibioperoneal trunk and peroneal artery was identified which was successfully recanalized and treated with balloon angioplasty.  This is her dominant  runoff.  At this point, I do not feel that I can improve her blood supply any further.  I think we need to maximize medical therapy.  Therefore I'm sending her to the wound center for consideration of possible hyperbaric therapy versus continuation of topical treatment.  I have her scheduled to follow-up with me in 6 weeks for a wound check.  She potentially could be a candidate for the Hemostimix trial.  I am placing her on Bactrim for recurrent infection.  She will need a duplex of her intervention in 3 months.    Durene Cal, MD [facility] [phone] Pager [phone]

## 2017-06-10 ENCOUNTER — Ambulatory Visit: Payer: Medicare Other | Admitting: Cardiology

## 2017-06-10 ENCOUNTER — Encounter: Payer: Self-pay | Admitting: Cardiology

## 2017-06-10 ENCOUNTER — Other Ambulatory Visit: Payer: Self-pay | Admitting: Adult Health

## 2017-06-10 NOTE — Progress Notes (Deleted)
Clinical Summary Kristy Espinoza is a 83 y.o.female seen today for follow up of the following medical problems.   1. Chronic combined systolic/diastolic HF - Echo LVEF 40%, diffuse hypokinesis, grade I diastolic dysfunction, mild MR.  - recent admission 10/2016 with CHF.  - appears plavix started by pcp for primary prevention in setting of aspirin allergy.  - discharge weight 130 lbs, negative 6.6 liters during admission. She had been around 140 on admission.  - weight range 130-133lbs.  - last Cr 2.02 by labs 12/06/16. Admit Cr 11/02/16 was 1.56.     - right leg swelling  over the last few days, has had some erythema in that area. Similar presentation to her recent issues with cellulitis in that leg.    2. PAD - right ABI 0.43, left 0.81 - referrered to vascular according to prior notes, she cancelled appointment - occasional pain right leg, bumped her toe recenly, has a slow healing sore on that foot.  - She has undergone angiography and an occluded tibioperoneal trunk and peroneal artery was identified which was successfully recanalized and treated with balloon angioplasty. - referred to wound care.    3. CKD III    Past Medical History:  Diagnosis Date  . Arthritis   . Back pain   . GERD (gastroesophageal reflux disease)   . High cholesterol   . Hypertension      Allergies  Allergen Reactions  . Ativan [Lorazepam] Other (See Comments)    Hallucinations   . Ciprocin-Fluocin-Procin [Fluocinolone] Diarrhea  . Ibuprofen Other (See Comments)    Due to abd surgery   . Versed [Midazolam] Other (See Comments)  . Aspirin Other (See Comments)    Due to abd surgery   . Avelox [Moxifloxacin Hcl In Nacl] Rash  . Tape Rash     Current Outpatient Prescriptions  Medication Sig Dispense Refill  . acetaminophen (TYLENOL) 500 MG tablet Take 1,000 mg by mouth every 8 (eight) hours as needed for mild pain or moderate pain.    Marland Kitchen atorvastatin (LIPITOR) 10 MG  tablet Take 1 tablet (10 mg total) by mouth daily at 6 PM. 30 tablet 0  . benazepril (LOTENSIN) 10 MG tablet Take 0.5 tablets (5 mg total) by mouth daily. 30 tablet 6  . busPIRone (BUSPAR) 10 MG tablet Take 10 mg by mouth 3 (three) times daily.    . clopidogrel (PLAVIX) 75 MG tablet Take 1 tablet (75 mg total) by mouth daily. 30 tablet 6  . clorazepate (TRANXENE) 7.5 MG tablet Take 7.5 mg by mouth daily.     . fenofibrate micronized (LOFIBRA) 67 MG capsule Take 67 mg by mouth daily before breakfast.    . ferrous sulfate 325 (65 FE) MG EC tablet Take 325 mg by mouth daily.    . furosemide (LASIX) 20 MG tablet Take 1 tablet (20 mg total) by mouth daily. 30 tablet 0  . HYDROcodone-acetaminophen (NORCO/VICODIN) 5-325 MG tablet Take 1 tablet by mouth every 6 (six) hours as needed for moderate pain.    . metoprolol succinate (TOPROL XL) 25 MG 24 hr tablet Take 1 tablet (25 mg total) by mouth daily. 90 tablet 1  . sulfamethoxazole-trimethoprim (BACTRIM,SEPTRA) 400-80 MG tablet Take 1 tablet by mouth 2 (two) times daily. 20 tablet 0  . tolterodine (DETROL LA) 4 MG 24 hr capsule Take 4 mg by mouth daily.    . Vitamin D, Ergocalciferol, (DRISDOL) 50000 units CAPS capsule Take 50,000 Units by mouth every 7 (seven)  days.     No current facility-administered medications for this visit.      Past Surgical History:  Procedure Laterality Date  . ABDOMINAL AORTOGRAM W/LOWER EXTREMITY N/A 03/25/2017   Procedure: Abdominal Aortogram w/Lower Extremity;  Surgeon: Nada Libman, MD;  Location: [facility];  Service: Cardiovascular;  Laterality: N/A;  . ABDOMINAL HYSTERECTOMY    . ABDOMINAL SURGERY    . CHOLECYSTECTOMY    . HERNIA REPAIR    . KIDNEY STONE SURGERY    . PERIPHERAL VASCULAR BALLOON ANGIOPLASTY Right 03/25/2017   Procedure: Peripheral Vascular Balloon Angioplasty;  Surgeon: Nada Libman, MD;  Location: [facility];  Service: Cardiovascular;  Laterality: Right;  peroneal      Allergies  Allergen Reactions  . Ativan [Lorazepam] Other (See Comments)    Hallucinations   . Ciprocin-Fluocin-Procin [Fluocinolone] Diarrhea  . Ibuprofen Other (See Comments)    Due to abd surgery   . Versed [Midazolam] Other (See Comments)  . Aspirin Other (See Comments)    Due to abd surgery   . Avelox [Moxifloxacin Hcl In Nacl] Rash  . Tape Rash      Family History  Problem Relation Age of Onset  . Cancer Father   . Kidney failure Mother      Social History Kristy Espinoza reports that she quit smoking about 25 years ago. Her smoking use included Cigarettes. She has a 40.00 pack-year smoking history. She has never used smokeless tobacco. Kristy Espinoza reports that she does not drink alcohol.   Review of Systems CONSTITUTIONAL: No weight loss, fever, chills, weakness or fatigue.  HEENT: Eyes: No visual loss, blurred vision, double vision or yellow sclerae.No hearing loss, sneezing, congestion, runny nose or sore throat.  SKIN: No rash or itching.  CARDIOVASCULAR:  RESPIRATORY: No shortness of breath, cough or sputum.  GASTROINTESTINAL: No anorexia, nausea, vomiting or diarrhea. No abdominal pain or blood.  GENITOURINARY: No burning on urination, no polyuria NEUROLOGICAL: No headache, dizziness, syncope, paralysis, ataxia, numbness or tingling in the extremities. No change in bowel or bladder control.  MUSCULOSKELETAL: No muscle, back pain, joint pain or stiffness.  LYMPHATICS: No enlarged nodes. No history of splenectomy.  PSYCHIATRIC: No history of depression or anxiety.  ENDOCRINOLOGIC: No reports of sweating, cold or heat intolerance. No polyuria or polydipsia.  Marland Kitchen   Physical Examination There were no vitals filed for this visit. There were no vitals filed for this visit.  Gen: resting comfortably, no acute distress HEENT: no scleral icterus, pupils equal round and reactive, no palptable cervical adenopathy,  CV Resp: Clear to auscultation  bilaterally GI: abdomen is soft, non-tender, non-distended, normal bowel sounds, no hepatosplenomegaly MSK: extremities are warm, no edema.  Skin: warm, no rash Neuro:  no focal deficits Psych: appropriate affect   Diagnostic Studies   10/2016 ABI FINDINGS: Right:  Resting ankle brachial index: 0.43  Segmental blood pressure: Symmetric upper extremity pressures  Doppler: Segmental Doppler of the distal right lower extremity demonstrates monophasic waveform of the posterior tibial artery and dorsalis pedis.  Left:  Resting ankle brachial index: 0.81  Segmental blood pressure: Symmetric upper extremity pressures.  Doppler: Segmental Doppler of the distal left lower extremity demonstrates biphasic waveform of the posterior tibial artery and monophasic waveform of dorsalis pedis.  IMPRESSION: Right:  Resting ankle-brachial index demonstrates moderate range arterial occlusive disease, with the ankle waveforms indicating at least tibial disease of the right lower extremity. If anatomic imaging is warranted to identify target for revascularization,  CT angiogram and runoff may be considered.  Left:  Resting ankle-brachial index in the mild range of arterial occlusive disease.  Waveforms at the ankle suggest tibial disease involving dorsalis pedis, with likely more proximal femoral popliteal disease.   10/2016 echo Study Conclusions  - Left ventricle: The cavity size was normal. Wall thickness was increased in a pattern of mild LVH. The estimated ejection fraction was 40%. Diffuse hypokinesis. Doppler parameters are consistent with abnormal left ventricular relaxation (grade 1 diastolic dysfunction). - Aortic valve: Mildly calcified annulus. Trileaflet; mildly calcified leaflets. Moderate calcification involving the right coronary cusp. - Mitral valve: Calcified annulus. There was mild regurgitation. - Left atrium: The atrium was mildly  dilated. - Right atrium: Central venous pressure (est): 3 mm Hg. - Atrial septum: A patent foramen ovale cannot be excluded. - Tricuspid valve: There was trivial regurgitation. - Pulmonary arteries: Systolic pressure could not be accurately estimated. - Pericardium, extracardiac: There was no pericardial effusion.  Impressions:  - Mild LVH with LVEF approximately 40%, overall diffuse hypokinesis. Grade 1 diastolic dysfunction. Mild left atrial enlargement. Mild mitral regurgitation. Trivial tricuspid regurgitation. Cannot exclude PFO.    Assessment and Plan  1. Chronic combined systolic/diastolic HF - weights are down from last visit. Recent right leg swelling likely due to surrounding cellulitis, possible venous insufficiency. She does not have evidence of total body fluid overload - continue current cardiac meds.   2. PAD - we will refer again to vascular, she cancelled last appt - right leg pain and erythema, low ABI around 0.4. Slow healing sore on foot.  - start kelfex  bid x 7 days for possible cellulitis.   3. CKD III - recheck labs  F/u 3 months      Antoine Poche, M.D., F.A.C.C.

## 2017-06-23 ENCOUNTER — Ambulatory Visit: Payer: Medicare Other | Admitting: Surgery

## 2017-07-15 ENCOUNTER — Other Ambulatory Visit: Payer: Self-pay | Admitting: Cardiology

## 2017-07-16 ENCOUNTER — Other Ambulatory Visit: Payer: Self-pay | Admitting: Cardiology

## 2017-07-21 ENCOUNTER — Ambulatory Visit: Payer: Medicare Other | Admitting: Surgery

## 2017-08-31 IMAGING — CT CT CHEST W/O CM
2 of 3 series · 15 of 36 positions shown, 18 images · non-contrast
Comparison: Chest radiography same day

CLINICAL DATA: Bilateral leg swelling over the last week. Recent
steroid administration.

EXAM:
CT CHEST WITHOUT CONTRAST
TECHNIQUE: Multidetector CT imaging of the chest was performed following the
standard protocol without IV contrast.

[Series 2: thorax · axial · 0.61mm/px · z∈[-317,-61]mm · 12 of 152 slices shown, 15 images]
[im 12/152  mediastinal]
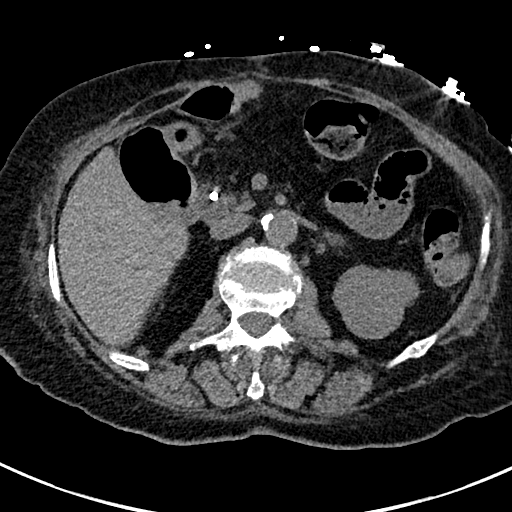
[im 12/152  lung]
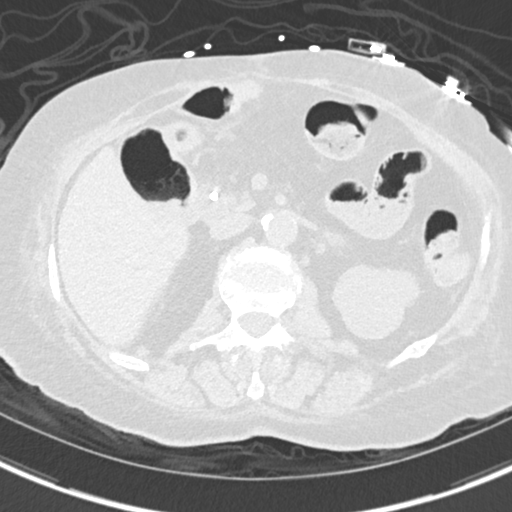
[im 23/152  lung]
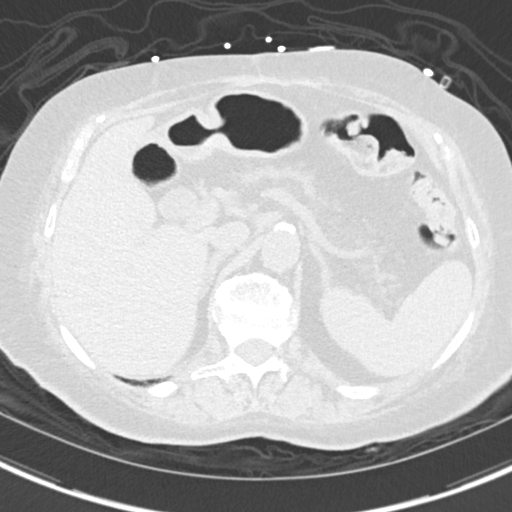
[im 34/152  lung]
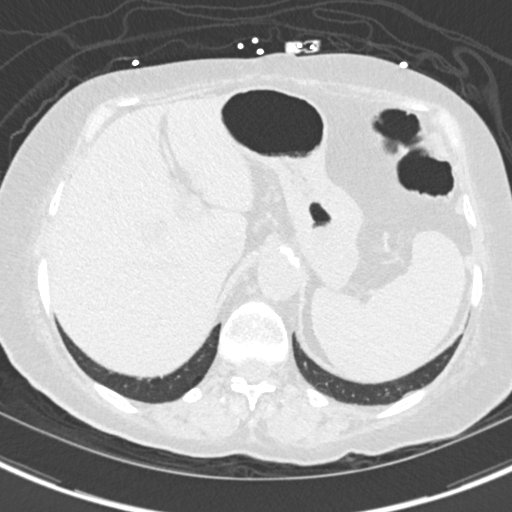
[im 45/152  lung]
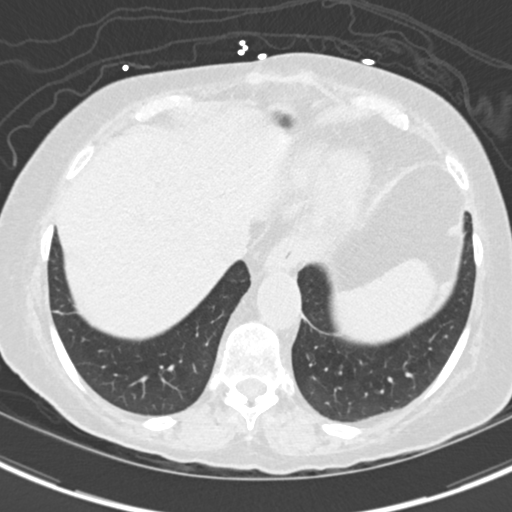
[im 56/152  mediastinal]
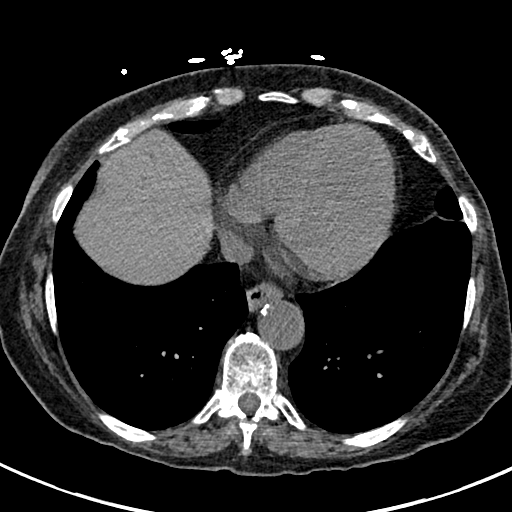
[im 56/152  lung]
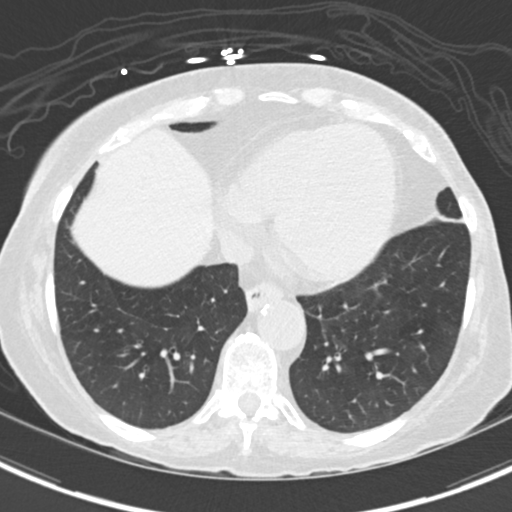
[im 68/152  lung]
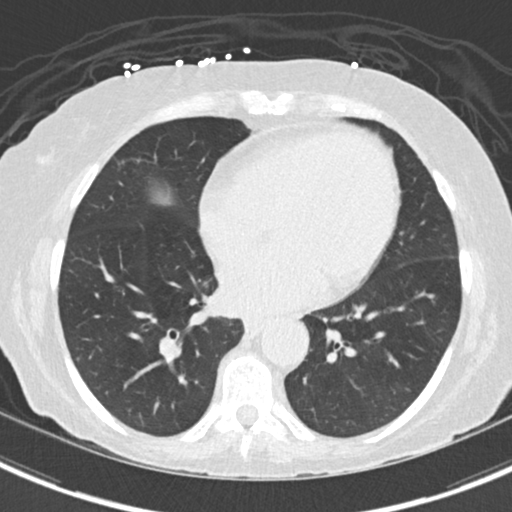
[im 84/152  lung]
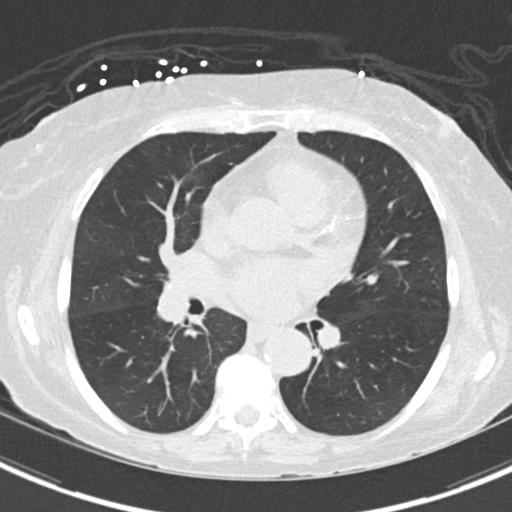
[im 96/152  lung]
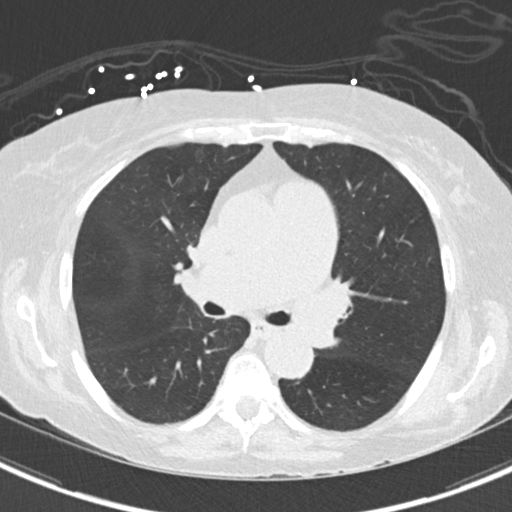
[im 107/152  mediastinal]
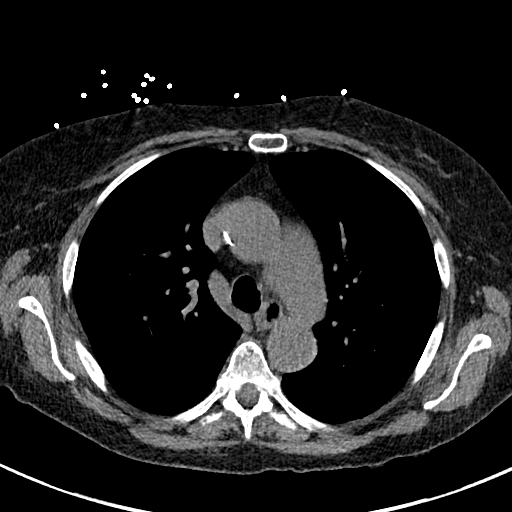
[im 107/152  lung]
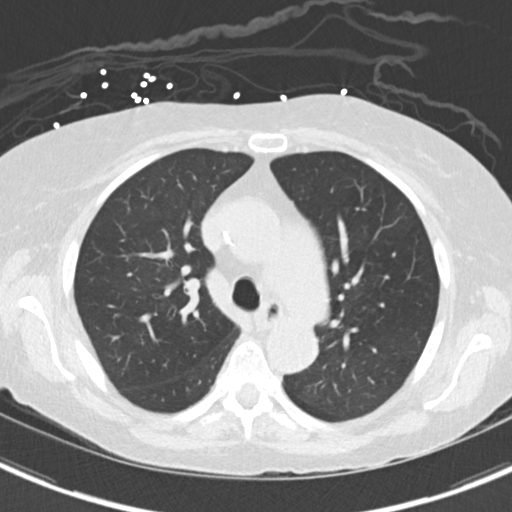
[im 118/152  lung]
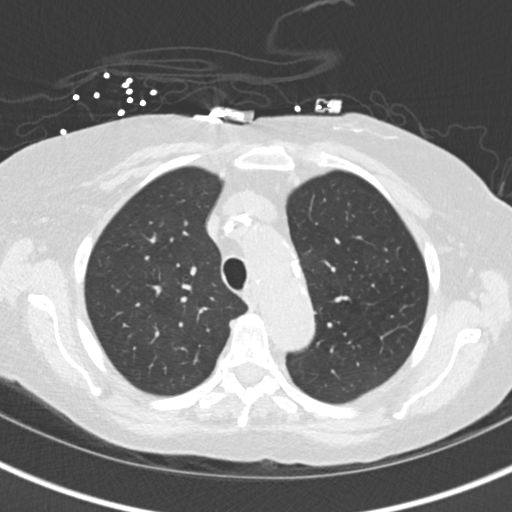
[im 129/152  lung]
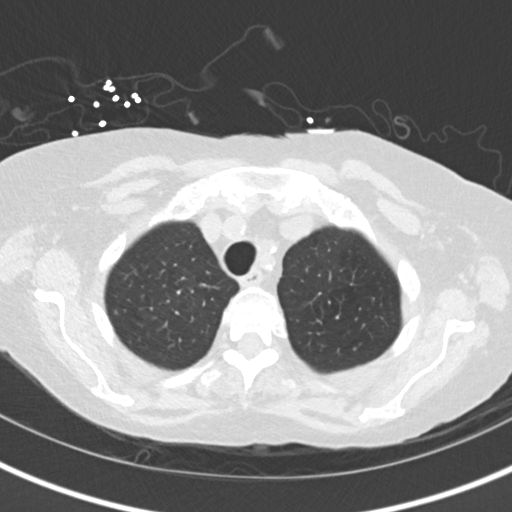
[im 140/152  lung]
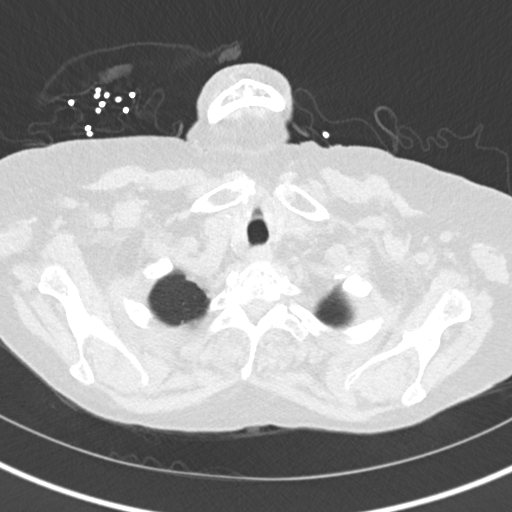

[Series 5: coronal · coronal · 0.59mm/px · 3 of 126 slices shown]
[im 26/126  lung]
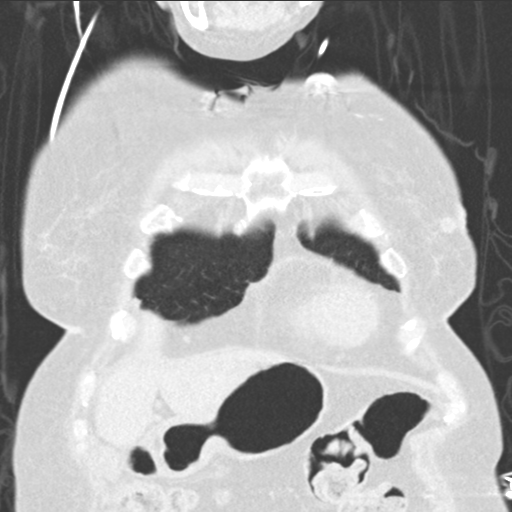
[im 51/126  lung]
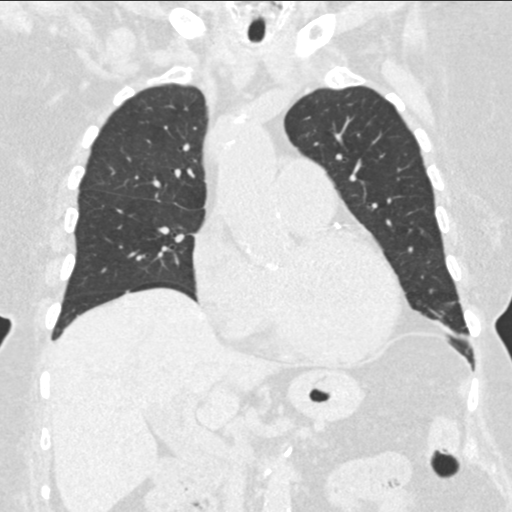
[im 76/126  lung]
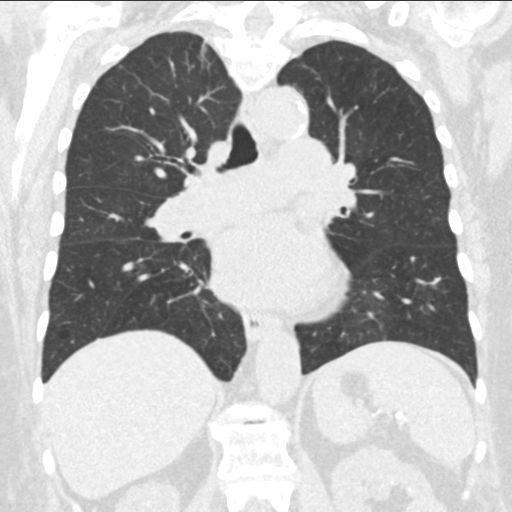

[15 of 36 positions shown; findings below may reference images not displayed]

FINDINGS: Cardiovascular: Heart size is normal. No pericardial fluid. There is
extensive coronary artery calcification. There is extensive aortic
calcification without evidence of aneurysm or dissection. Proximal
brachiocephalic vessels show extensive atherosclerotic
calcification.

Mediastinum/Nodes: No mediastinal or hilar mass or lymphadenopathy.

Lungs/Pleura: No infiltrate, collapse or effusion. There are
scattered bilateral pulmonary nodules bilaterally. The largest
include a 6 mm nodule in the right upper lobe anteriorly image 52,
an 8 mm nodule in the right lower lobe Image 92, and a 4 mm nodule
in the left lower lobe image 84. There are numerous other 2-3 mm
nodules scattered within both lungs. These are likely to be benign,
but do require follow-up. See below.

Upper Abdomen: Indeterminate density rounded area at the upper pole
of left kidney could represent a cyst or a mass. This measures up to
3.4 cm in diameter.

Musculoskeletal: Chronic spinal degenerative changes.
IMPRESSION: Extensive atherosclerotic disease of the coronary arteries, aorta
and brachiocephalic branch vessels.

Multiple pulmonary nodules as outlined above. These require further
follow-up. Non-contrast chest CT at 3-6 months is recommended. If
the nodules are stable at time of repeat CT, then future CT at 18-24
months (from today's scan) is considered optional for low-risk
patients, but is recommended for high-risk patients. This
recommendation follows the consensus statement: Guidelines for
Management of Incidental Pulmonary Nodules Detected on CT Images:

Up to 3.4 cm in diameter rounded entity at the upper pole the left
kidney which is of indeterminate density. This could represent a
cyst or mass. This could be evaluated by further cross-sectional
imaging including ultrasound or CT with contrast.

## 2017-09-10 ENCOUNTER — Other Ambulatory Visit: Payer: Self-pay | Admitting: Cardiology

## 2017-10-08 ENCOUNTER — Other Ambulatory Visit: Payer: Self-pay | Admitting: Cardiology

## 2017-10-10 ENCOUNTER — Other Ambulatory Visit: Payer: Self-pay

## 2017-10-10 MED ORDER — METOPROLOL SUCCINATE ER 25 MG PO TB24: 25.0000 mg | ORAL_TABLET | Freq: Every day | ORAL | 2 refills | Status: AC

## 2018-01-12 ENCOUNTER — Other Ambulatory Visit: Payer: Self-pay | Admitting: *Deleted

## 2018-01-12 ENCOUNTER — Ambulatory Visit: Payer: Medicare Other | Admitting: Cardiology

## 2018-01-12 MED ORDER — BENAZEPRIL HCL 10 MG PO TABS: 5.0000 mg | ORAL_TABLET | Freq: Every day | ORAL | 0 refills | Status: DC

## 2018-01-12 MED ORDER — FUROSEMIDE 20 MG PO TABS: 20.0000 mg | ORAL_TABLET | Freq: Every day | ORAL | 0 refills | Status: AC

## 2018-01-12 MED ORDER — METOPROLOL SUCCINATE ER 25 MG PO TB24: 25.0000 mg | ORAL_TABLET | Freq: Every day | ORAL | 0 refills | Status: DC

## 2018-01-12 MED ORDER — CLOPIDOGREL BISULFATE 75 MG PO TABS: 75.0000 mg | ORAL_TABLET | Freq: Every day | ORAL | 0 refills | Status: DC

## 2018-01-12 MED ORDER — ATORVASTATIN CALCIUM 10 MG PO TABS: 10.0000 mg | ORAL_TABLET | Freq: Every day | ORAL | 0 refills | Status: AC

## 2018-01-12 NOTE — Progress Notes (Deleted)
Clinical Summary Kristy Espinoza is a 84 y.o.female seen today for follow up of the following medical problems.   1. Chronic combined systolic/diastolic HF - Echo LVEF 40%, diffuse hypokinesis, grade I diastolic dysfunction, mild MR.  - recent admission 10/2016 with CHF.  - appears plavix started by pcp for primary prevention in setting of aspirin allergy.  - discharge weight 130 lbs, negative 6.6 liters during admission. She had been around 140 on admission.  - weight range 130-133lbs.  - last Cr 2.02 by labs 12/06/16. Admit Cr 11/02/16 was 1.56.     - right leg swelling  over the last few days, has had some erythema in that area. Similar presentation to her recent issues with cellulitis in that leg.    2. PAD - right ABI 0.43, left 0.81 - referrered to vascular according to prior notes, she cancelled appointment - occasional pain right leg, bumped her toe recenly, has a slow healing sore on that foot.    3. CKD III       Past Medical History:  Diagnosis Date  . Arthritis   . Back pain   . GERD (gastroesophageal reflux disease)   . High cholesterol   . Hypertension      Allergies  Allergen Reactions  . Ativan [Lorazepam] Other (See Comments)    Hallucinations   . Ciprocin-Fluocin-Procin [Fluocinolone] Diarrhea  . Ibuprofen Other (See Comments)    Due to abd surgery   . Versed [Midazolam] Other (See Comments)  . Aspirin Other (See Comments)    Due to abd surgery   . Avelox [Moxifloxacin Hcl In Nacl] Rash  . Tape Rash     Current Outpatient Medications  Medication Sig Dispense Refill  . acetaminophen (TYLENOL) 500 MG tablet Take 1,000 mg by mouth every 8 (eight) hours as needed for mild pain or moderate pain.    Marland Kitchen atorvastatin (LIPITOR) 10 MG tablet Take 1 tablet (10 mg total) by mouth daily at 6 PM. 30 tablet 0  . benazepril (LOTENSIN) 10 MG tablet Take 0.5 tablets (5 mg total) by mouth daily. 30 tablet 6  . busPIRone (BUSPAR) 10 MG tablet  Take 10 mg by mouth 3 (three) times daily.    . clopidogrel (PLAVIX) 75 MG tablet Take 1 tablet (75 mg total) by mouth daily. 30 tablet 6  . clorazepate (TRANXENE) 7.5 MG tablet Take 7.5 mg by mouth daily.     . fenofibrate micronized (LOFIBRA) 67 MG capsule Take 67 mg by mouth daily before breakfast.    . ferrous sulfate 325 (65 FE) MG EC tablet Take 325 mg by mouth daily.    . furosemide (LASIX) 20 MG tablet Take 1 tablet (20 mg total) by mouth daily. 30 tablet 0  . HYDROcodone-acetaminophen (NORCO/VICODIN) 5-325 MG tablet Take 1 tablet by mouth every 6 (six) hours as needed for moderate pain.    . metoprolol succinate (TOPROL-XL) 25 MG 24 hr tablet Take 1 tablet by mouth every day 30 tablet 0  . metoprolol succinate (TOPROL-XL) 25 MG 24 hr tablet Take 1 tablet by mouth every day 15 tablet 0  . metoprolol succinate (TOPROL-XL) 25 MG 24 hr tablet Take 1 tablet (25 mg total) by mouth daily. 30 tablet 2  . sulfamethoxazole-trimethoprim (BACTRIM,SEPTRA) 400-80 MG tablet Take 1 tablet by mouth 2 (two) times daily. 20 tablet 0  . tolterodine (DETROL LA) 4 MG 24 hr capsule Take 4 mg by mouth daily.    . Vitamin D, Ergocalciferol, (DRISDOL)  50000 units CAPS capsule Take 50,000 Units by mouth every 7 (seven) days.     No current facility-administered medications for this visit.      Past Surgical History:  Procedure Laterality Date  . ABDOMINAL AORTOGRAM W/LOWER EXTREMITY N/A 03/25/2017   Procedure: Abdominal Aortogram w/Lower Extremity;  Surgeon: Nada Libman, MD;  Location: [facility];  Service: Cardiovascular;  Laterality: N/A;  . ABDOMINAL HYSTERECTOMY    . ABDOMINAL SURGERY    . CHOLECYSTECTOMY    . HERNIA REPAIR    . KIDNEY STONE SURGERY    . PERIPHERAL VASCULAR BALLOON ANGIOPLASTY Right 03/25/2017   Procedure: Peripheral Vascular Balloon Angioplasty;  Surgeon: Nada Libman, MD;  Location: [facility];  Service: Cardiovascular;  Laterality: Right;  peroneal      Allergies  Allergen Reactions  . Ativan [Lorazepam] Other (See Comments)    Hallucinations   . Ciprocin-Fluocin-Procin [Fluocinolone] Diarrhea  . Ibuprofen Other (See Comments)    Due to abd surgery   . Versed [Midazolam] Other (See Comments)  . Aspirin Other (See Comments)    Due to abd surgery   . Avelox [Moxifloxacin Hcl In Nacl] Rash  . Tape Rash      Family History  Problem Relation Age of Onset  . Cancer Father   . Kidney failure Mother      Social History Kristy Espinoza reports that she quit smoking about 26 years ago. Her smoking use included cigarettes. She has a 40.00 pack-year smoking history. She has never used smokeless tobacco. Kristy Espinoza reports that she does not drink alcohol.   Review of Systems CONSTITUTIONAL: No weight loss, fever, chills, weakness or fatigue.  HEENT: Eyes: No visual loss, blurred vision, double vision or yellow sclerae.No hearing loss, sneezing, congestion, runny nose or sore throat.  SKIN: No rash or itching.  CARDIOVASCULAR:  RESPIRATORY: No shortness of breath, cough or sputum.  GASTROINTESTINAL: No anorexia, nausea, vomiting or diarrhea. No abdominal pain or blood.  GENITOURINARY: No burning on urination, no polyuria NEUROLOGICAL: No headache, dizziness, syncope, paralysis, ataxia, numbness or tingling in the extremities. No change in bowel or bladder control.  MUSCULOSKELETAL: No muscle, back pain, joint pain or stiffness.  LYMPHATICS: No enlarged nodes. No history of splenectomy.  PSYCHIATRIC: No history of depression or anxiety.  ENDOCRINOLOGIC: No reports of sweating, cold or heat intolerance. No polyuria or polydipsia.  Marland Kitchen   Physical Examination There were no vitals filed for this visit. There were no vitals filed for this visit.  Gen: resting comfortably, no acute distress HEENT: no scleral icterus, pupils equal round and reactive, no palptable cervical adenopathy,  CV Resp: Clear to auscultation  bilaterally GI: abdomen is soft, non-tender, non-distended, normal bowel sounds, no hepatosplenomegaly MSK: extremities are warm, no edema.  Skin: warm, no rash Neuro:  no focal deficits Psych: appropriate affect   Diagnostic Studies 10/2016 ABI FINDINGS: Right:  Resting ankle brachial index: 0.43  Segmental blood pressure: Symmetric upper extremity pressures  Doppler: Segmental Doppler of the distal right lower extremity demonstrates monophasic waveform of the posterior tibial artery and dorsalis pedis.  Left:  Resting ankle brachial index: 0.81  Segmental blood pressure: Symmetric upper extremity pressures.  Doppler: Segmental Doppler of the distal left lower extremity demonstrates biphasic waveform of the posterior tibial artery and monophasic waveform of dorsalis pedis.  IMPRESSION: Right:  Resting ankle-brachial index demonstrates moderate range arterial occlusive disease, with the ankle waveforms indicating at least tibial disease of the right lower extremity.  If anatomic imaging is warranted to identify target for revascularization, CT angiogram and runoff may be considered.  Left:  Resting ankle-brachial index in the mild range of arterial occlusive disease.  Waveforms at the ankle suggest tibial disease involving dorsalis pedis, with likely more proximal femoral popliteal disease.   10/2016 echo Study Conclusions  - Left ventricle: The cavity size was normal. Wall thickness was increased in a pattern of mild LVH. The estimated ejection fraction was 40%. Diffuse hypokinesis. Doppler parameters are consistent with abnormal left ventricular relaxation (grade 1 diastolic dysfunction). - Aortic valve: Mildly calcified annulus. Trileaflet; mildly calcified leaflets. Moderate calcification involving the right coronary cusp. - Mitral valve: Calcified annulus. There was mild regurgitation. - Left atrium: The atrium was mildly  dilated. - Right atrium: Central venous pressure (est): 3 mm Hg. - Atrial septum: A patent foramen ovale cannot be excluded. - Tricuspid valve: There was trivial regurgitation. - Pulmonary arteries: Systolic pressure could not be accurately estimated. - Pericardium, extracardiac: There was no pericardial effusion.  Impressions:  - Mild LVH with LVEF approximately 40%, overall diffuse hypokinesis. Grade 1 diastolic dysfunction. Mild left atrial enlargement. Mild mitral regurgitation. Trivial tricuspid regurgitation. Cannot exclude PFO.      Assessment and Plan  1. Chronic combined systolic/diastolic HF - weights are down from last visit. Recent right leg swelling likely due to surrounding cellulitis, possible venous insufficiency. She does not have evidence of total body fluid overload - continue current cardiac meds.   2. PAD - we will refer again to vascular, she cancelled last appt - right leg pain and erythema, low ABI around 0.4. Slow healing sore on foot.  - start kelfex 500mg  bid x 7 days for possible cellulitis.   3. CKD III - recheck labs  F/u 3 months       Antoine PocheJonathan F. Branch, M.D., F.A.C.C.

## 2018-03-16 ENCOUNTER — Ambulatory Visit (INDEPENDENT_AMBULATORY_CARE_PROVIDER_SITE_OTHER): Payer: Medicare Other | Admitting: Cardiology

## 2018-03-16 ENCOUNTER — Encounter: Payer: Self-pay | Admitting: Cardiology

## 2018-03-16 VITALS — BP 122/70 | HR 80 | Ht 62.0 in | Wt 115.6 lb

## 2018-03-16 DIAGNOSIS — N183 Chronic kidney disease, stage 3 unspecified: Secondary | ICD-10-CM

## 2018-03-16 DIAGNOSIS — I739 Peripheral vascular disease, unspecified: Secondary | ICD-10-CM | POA: Diagnosis not present

## 2018-03-16 DIAGNOSIS — I5042 Chronic combined systolic (congestive) and diastolic (congestive) heart failure: Secondary | ICD-10-CM

## 2018-03-16 NOTE — Patient Instructions (Signed)

## 2018-03-16 NOTE — Progress Notes (Signed)
Clinical Summary [name] is a 84 y.o.female seen today for follow up of the following medical problems.   1. Chronic combined systolic/diastolic HF - Echo LVEF 40%, diffuse hypokinesis, grade I diastolic dysfunction, mild MR.  - recent admission 10/2016 with CHF.  - appears plavix started by pcp for primary prevention in setting of aspirin allergy.  - discharge weight 130 lbs, negative 6.6 liters during admission. She had been around 140 on admission.  -   - no recent SOB/DOE.  - recent admission to [facility] [place] for anemia, UTI. Admitted for about 1 week, discharge to rehab. - no recent edema. They report much improvement after starting HCTZ.  - home weights stable 120.  2. PAD - right ABI 0.43, left 0.81  - s/p right peroneal and tibioperonal angioplasty 03/2017 - referred to wound care at last vasscular appt for ongoing right foot ulcer   3. CKD III  Past Medical History:  Diagnosis Date  . Arthritis   . Back pain   . GERD (gastroesophageal reflux disease)   . High cholesterol   . Hypertension      Allergies  Allergen Reactions  . Ativan [Lorazepam] Other (See Comments)    Hallucinations   . Ciprocin-Fluocin-Procin [Fluocinolone] Diarrhea  . Ibuprofen Other (See Comments)    Due to abd surgery   . Versed [Midazolam] Other (See Comments)  . Aspirin Other (See Comments)    Due to abd surgery   . Avelox [Moxifloxacin Hcl In Nacl] Rash  . Tape Rash     Current Outpatient Medications  Medication Sig Dispense Refill  . acetaminophen (TYLENOL) 500 MG tablet Take 1,000 mg by mouth every 8 (eight) hours as needed for mild pain or moderate pain.    Marland Kitchen atorvastatin (LIPITOR) 10 MG tablet Take 1 tablet (10 mg total) by mouth daily at 6 PM. 30 tablet 0  . benazepril (LOTENSIN) 10 MG tablet Take 0.5 tablets (5 mg total) by mouth daily. 15 tablet 0  . busPIRone (BUSPAR) 10 MG tablet Take 10 mg by mouth 3 (three) times daily.    . clopidogrel (PLAVIX) 75  MG tablet Take 1 tablet (75 mg total) by mouth daily. 30 tablet 0  . clorazepate (TRANXENE) 7.5 MG tablet Take 7.5 mg by mouth daily.     . fenofibrate micronized (LOFIBRA) 67 MG capsule Take 67 mg by mouth daily before breakfast.    . ferrous sulfate 325 (65 FE) MG EC tablet Take 325 mg by mouth daily.    . furosemide (LASIX) 20 MG tablet Take 1 tablet (20 mg total) by mouth daily. 30 tablet 0  . HYDROcodone-acetaminophen (NORCO/VICODIN) 5-325 MG tablet Take 1 tablet by mouth every 6 (six) hours as needed for moderate pain.    . metoprolol succinate (TOPROL-XL) 25 MG 24 hr tablet Take 1 tablet by mouth every day 15 tablet 0  . metoprolol succinate (TOPROL-XL) 25 MG 24 hr tablet Take 1 tablet (25 mg total) by mouth daily. 30 tablet 2  . metoprolol succinate (TOPROL-XL) 25 MG 24 hr tablet Take 1 tablet (25 mg total) by mouth daily. 30 tablet 0  . sulfamethoxazole-trimethoprim (BACTRIM,SEPTRA) 400-80 MG tablet Take 1 tablet by mouth 2 (two) times daily. 20 tablet 0  . tolterodine (DETROL LA) 4 MG 24 hr capsule Take 4 mg by mouth daily.    . Vitamin D, Ergocalciferol, (DRISDOL) 50000 units CAPS capsule Take 50,000 Units by mouth every 7 (seven) days.  No current facility-administered medications for this visit.      Past Surgical History:  Procedure Laterality Date  . ABDOMINAL AORTOGRAM W/LOWER EXTREMITY N/A 03/25/2017   Procedure: Abdominal Aortogram w/Lower Extremity;  Surgeon: Nada LibmanBrabham, Vance W, MD;  Location: [facility];  Service: Cardiovascular;  Laterality: N/A;  . ABDOMINAL HYSTERECTOMY    . ABDOMINAL SURGERY    . CHOLECYSTECTOMY    . HERNIA REPAIR    . KIDNEY STONE SURGERY    . PERIPHERAL VASCULAR BALLOON ANGIOPLASTY Right 03/25/2017   Procedure: Peripheral Vascular Balloon Angioplasty;  Surgeon: Nada LibmanBrabham, Vance W, MD;  Location: [facility];  Service: Cardiovascular;  Laterality: Right;  peroneal     Allergies  Allergen Reactions  . Ativan [Lorazepam] Other (See  Comments)    Hallucinations   . Ciprocin-Fluocin-Procin [Fluocinolone] Diarrhea  . Ibuprofen Other (See Comments)    Due to abd surgery   . Versed [Midazolam] Other (See Comments)  . Aspirin Other (See Comments)    Due to abd surgery   . Avelox [Moxifloxacin Hcl In Nacl] Rash  . Tape Rash      Family History  Problem Relation Age of Onset  . Cancer Father   . Kidney failure Mother      Social History [name] reports that she quit smoking about 26 years ago. Her smoking use included cigarettes. She has a 40.00 pack-year smoking history. She has never used smokeless tobacco. [name] reports that she does not drink alcohol.   Review of Systems CONSTITUTIONAL: No weight loss, fever, chills, weakness or fatigue.  HEENT: Eyes: No visual loss, blurred vision, double vision or yellow sclerae.No hearing loss, sneezing, congestion, runny nose or sore throat.  SKIN: No rash or itching.  CARDIOVASCULAR: per hpi RESPIRATORY: per hpi GASTROINTESTINAL: No anorexia, nausea, vomiting or diarrhea. No abdominal pain or blood.  GENITOURINARY: No burning on urination, no polyuria NEUROLOGICAL: No headache, dizziness, syncope, paralysis, ataxia, numbness or tingling in the extremities. No change in bowel or bladder control.  MUSCULOSKELETAL: No muscle, back pain, joint pain or stiffness.  LYMPHATICS: No enlarged nodes. No history of splenectomy.  PSYCHIATRIC: No history of depression or anxiety.  ENDOCRINOLOGIC: No reports of sweating, cold or heat intolerance. No polyuria or polydipsia.  Marland Kitchen.   Physical Examination Vitals:   03/16/18 1352  BP: 122/70  Pulse: 80  SpO2: 97%   Vitals:   03/16/18 1352  Weight: 115 lb 9.6 oz (52.4 kg)  Height: 5\' 2"  (1.575 m)    Gen: resting comfortably, no acute distress HEENT: no scleral icterus, pupils equal round and reactive, no palptable cervical adenopathy,  CV: RRR, no mr//g, no jvd Resp: Clear to auscultation bilaterally GI:  abdomen is soft, non-tender, non-distended, normal bowel sounds, no hepatosplenomegaly MSK: extremities are warm, no edema.  Skin: warm, no rash Neuro:  no focal deficits Psych: appropriate affect   Diagnostic Studies 10/2016 ABI FINDINGS: Right:  Resting ankle brachial index: 0.43  Segmental blood pressure: Symmetric upper extremity pressures  Doppler: Segmental Doppler of the distal right lower extremity demonstrates monophasic waveform of the posterior tibial artery and dorsalis pedis.  Left:  Resting ankle brachial index: 0.81  Segmental blood pressure: Symmetric upper extremity pressures.  Doppler: Segmental Doppler of the distal left lower extremity demonstrates biphasic waveform of the posterior tibial artery and monophasic waveform of dorsalis pedis.  IMPRESSION: Right:  Resting ankle-brachial index demonstrates moderate range arterial occlusive disease, with the ankle waveforms indicating at least tibial disease of  the right lower extremity. If anatomic imaging is warranted to identify target for revascularization, CT angiogram and runoff may be considered.  Left:  Resting ankle-brachial index in the mild range of arterial occlusive disease.  Waveforms at the ankle suggest tibial disease involving dorsalis pedis, with likely more proximal femoral popliteal disease.   10/2016 echo Study Conclusions  - Left ventricle: The cavity size was normal. Wall thickness was increased in a pattern of mild LVH. The estimated ejection fraction was 40%. Diffuse hypokinesis. Doppler parameters are consistent with abnormal left ventricular relaxation (grade 1 diastolic dysfunction). - Aortic valve: Mildly calcified annulus. Trileaflet; mildly calcified leaflets. Moderate calcification involving the right coronary cusp. - Mitral valve: Calcified annulus. There was mild regurgitation. - Left atrium: The atrium was mildly dilated. - Right  atrium: Central venous pressure (est): 3 mm Hg. - Atrial septum: A patent foramen ovale cannot be excluded. - Tricuspid valve: There was trivial regurgitation. - Pulmonary arteries: Systolic pressure could not be accurately estimated. - Pericardium, extracardiac: There was no pericardial effusion.  Impressions:  - Mild LVH with LVEF approximately 40%, overall diffuse hypokinesis. Grade 1 diastolic dysfunction. Mild left atrial enlargement. Mild mitral regurgitation. Trivial tricuspid regurgitation. Cannot exclude PFO.      Assessment and Plan  1. Chronic combined systolic/diastolic HF - doing well, continue currentm eds EKG today shows NSR  2. PAD -continue medical therapy   3. CKD III - continue to monitor labs, limit nephrotoxic meds       Antoine Poche, M.D..

## 2018-03-23 ENCOUNTER — Other Ambulatory Visit: Payer: Self-pay | Admitting: Cardiology

## 2018-04-27 IMAGING — US US EXTREM LOW VENOUS*R*
1 series · 14 of 24 positions shown · non-contrast
Comparison: None.

CLINICAL DATA: Right lower extremity edema for 1 week

EXAM:
RIGHT LOWER EXTREMITY VENOUS DUPLEX ULTRASOUND
TECHNIQUE: Doppler venous assessment of the right lower extremity deep venous
system was performed, including characterization of spectral flow,
compressibility, and phasicity.

[Series 1: us extrem low venous*right* · 0.07mm/px · 14 of 41 slices shown]
[im 1/41]
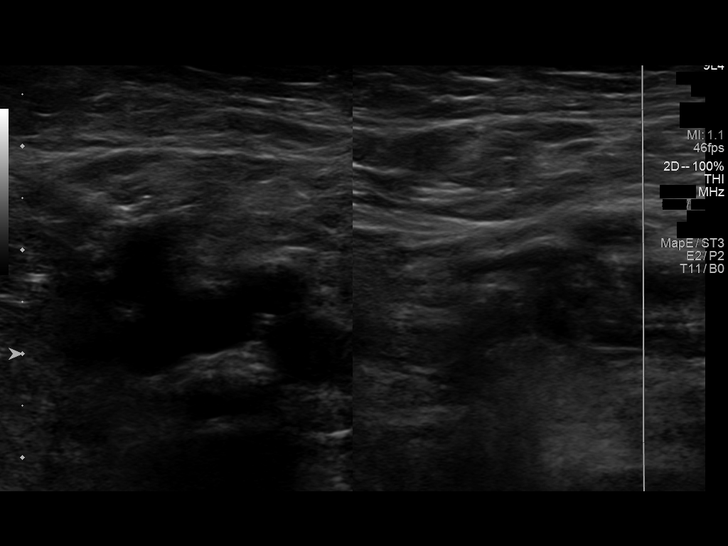
[im 4/41]
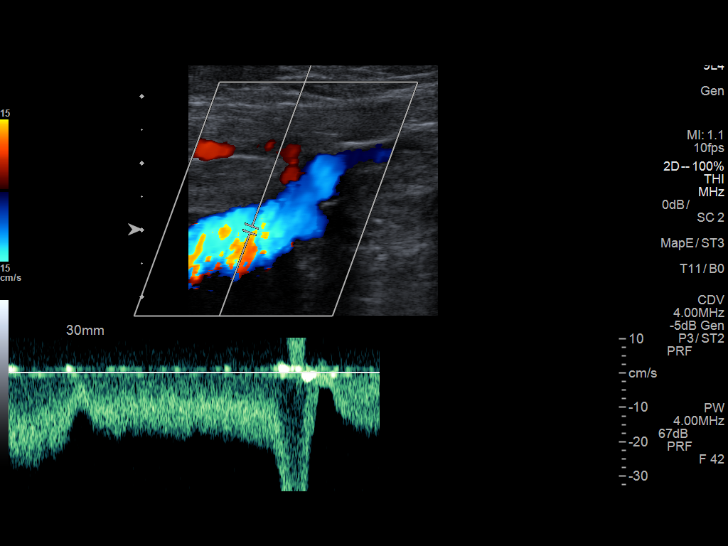
[im 7/41]
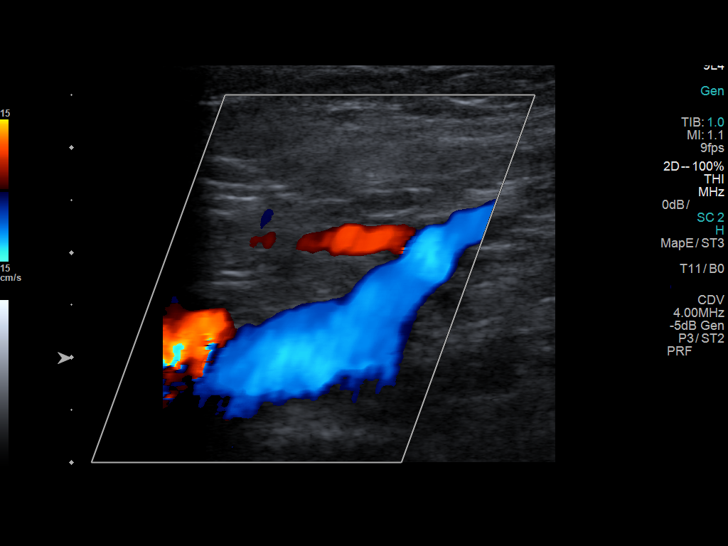
[im 11/41]
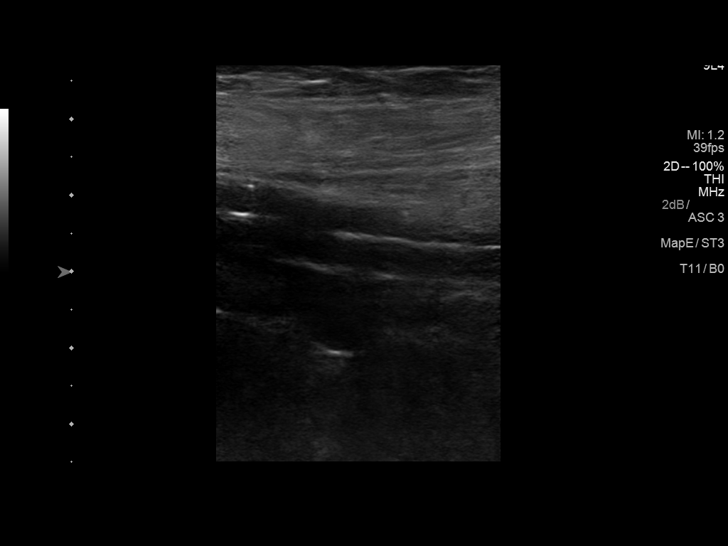
[im 13/41]
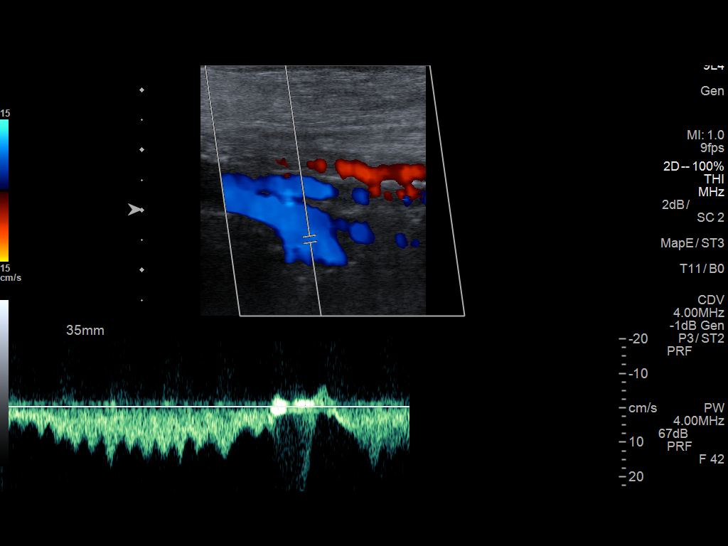
[im 16/41]
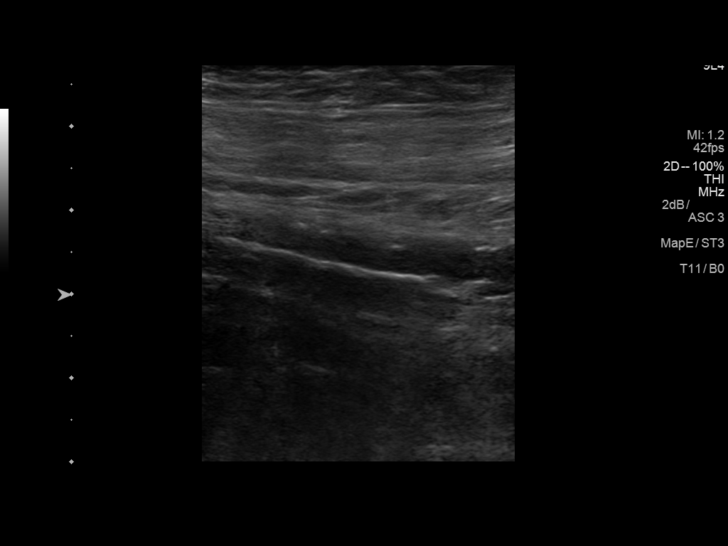
[im 20/41]
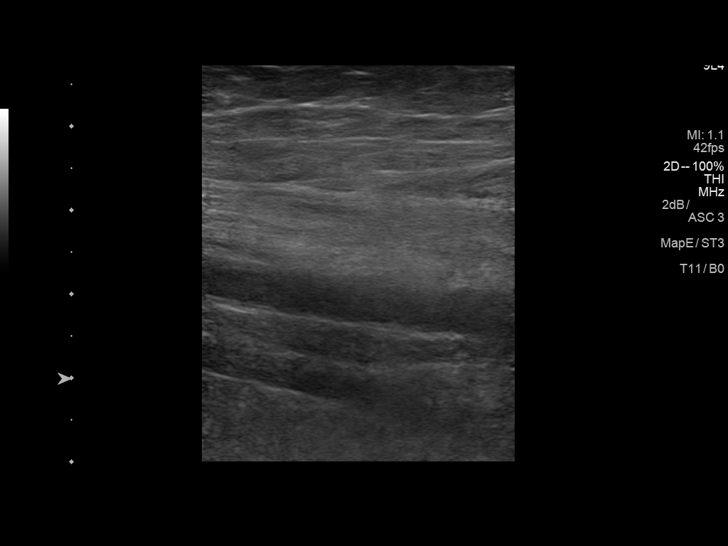
[im 21/41]
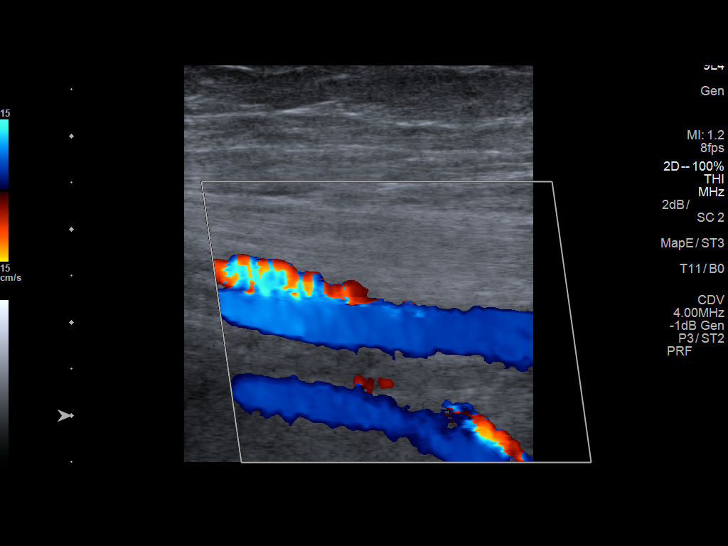
[im 25/41]
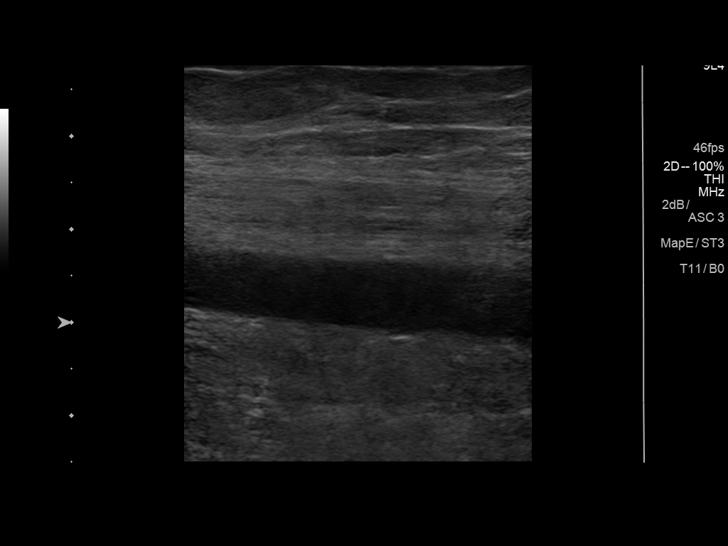
[im 28/41]
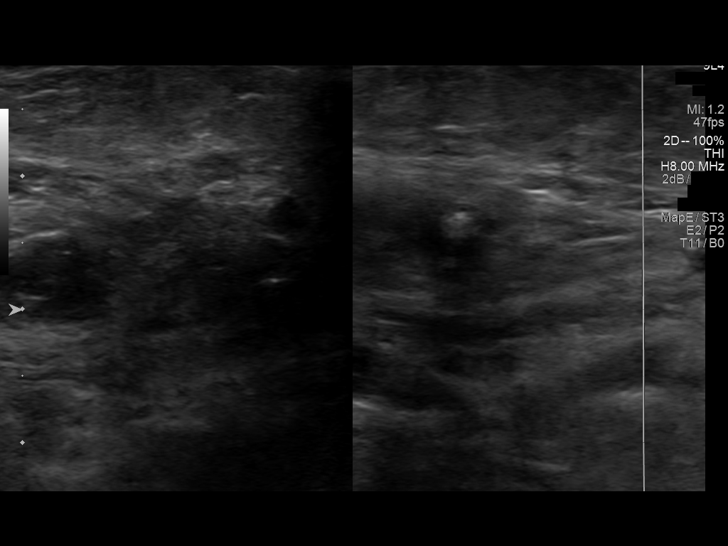
[im 32/41]
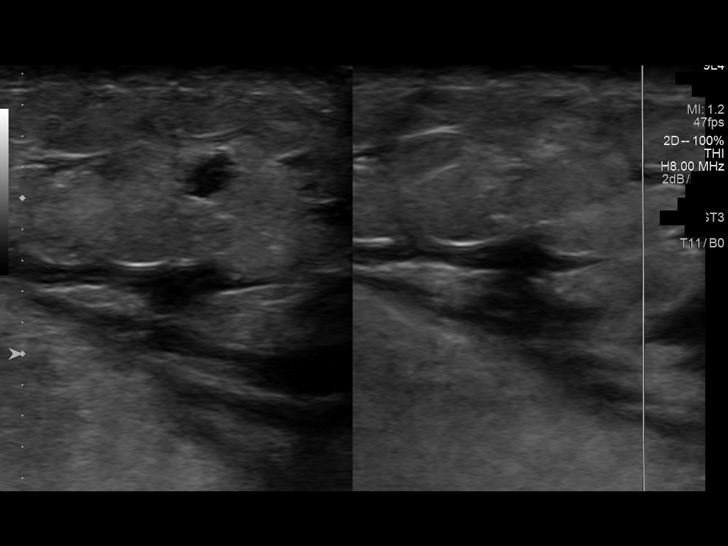
[im 34/41]
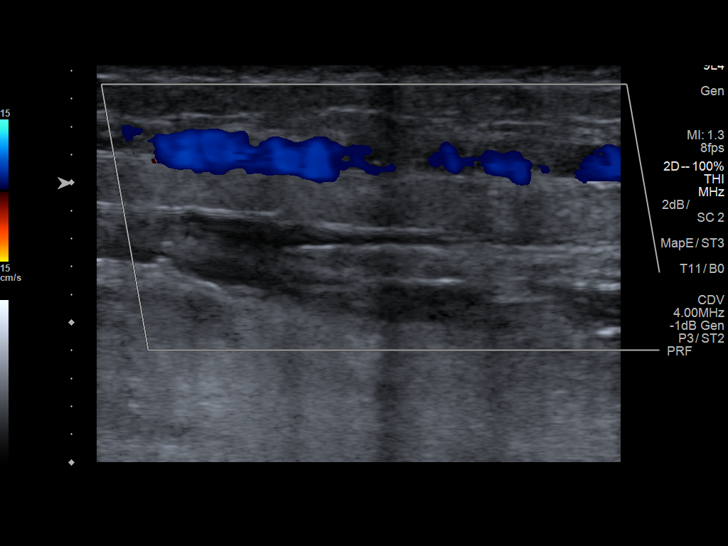
[im 37/41]
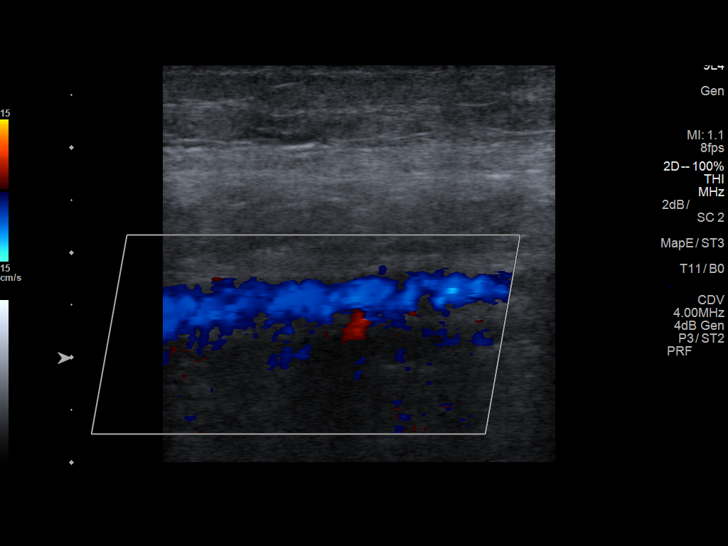
[im 41/41]
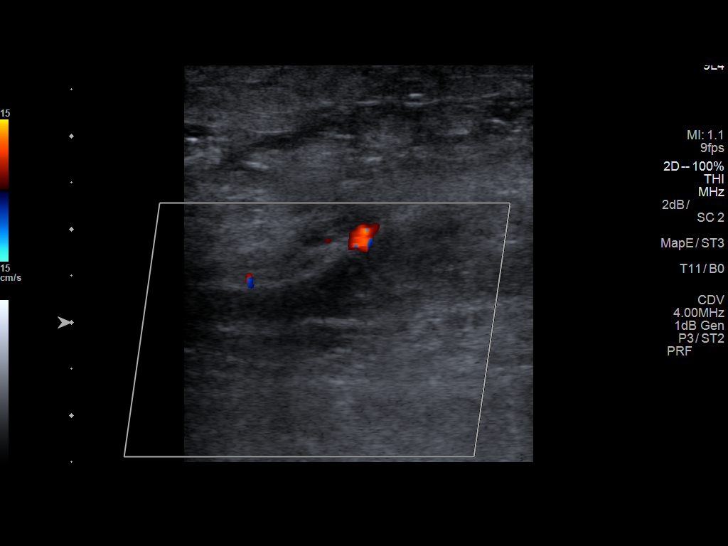

[14 of 24 positions shown; findings below may reference images not displayed]

FINDINGS: There is complete compressibility of the right common femoral,
femoral, and popliteal veins. Doppler analysis demonstrates
respiratory phasicity and augmentation of flow with calf
compression. No obvious superficial vein or calf vein thrombosis.
IMPRESSION: No evidence of right lower extremity DVT.

## 2018-06-30 ENCOUNTER — Encounter: Payer: Self-pay | Admitting: "Endocrinology

## 2018-06-30 LAB — BASIC METABOLIC PANEL
BUN: 57 — AB (ref 4–21)
CREATININE: 1.4 — AB (ref 0.5–1.1)

## 2018-06-30 LAB — TSH: TSH: 3.1 (ref <=5.90)

## 2018-07-07 ENCOUNTER — Encounter: Payer: Self-pay | Admitting: "Endocrinology

## 2018-07-09 ENCOUNTER — Encounter: Payer: Self-pay | Admitting: "Endocrinology

## 2018-07-09 ENCOUNTER — Ambulatory Visit (INDEPENDENT_AMBULATORY_CARE_PROVIDER_SITE_OTHER): Payer: Medicare Other | Admitting: "Endocrinology

## 2018-07-09 VITALS — BP 110/68 | HR 79

## 2018-07-09 DIAGNOSIS — R7989 Other specified abnormal findings of blood chemistry: Secondary | ICD-10-CM

## 2018-07-09 DIAGNOSIS — R634 Abnormal weight loss: Secondary | ICD-10-CM | POA: Insufficient documentation

## 2018-07-09 NOTE — Progress Notes (Signed)
Endocrinology Consult Note                                            07/09/2018, 12:23 PM   Subjective:    Patient ID: Kristy Espinoza, female    DOB: [birth year], PCP System, Provider Not In   Past Medical History:  Diagnosis Date  . Arthritis   . Back pain   . GERD (gastroesophageal reflux disease)   . High cholesterol   . Hypertension    Past Surgical History:  Procedure Laterality Date  . ABDOMINAL AORTOGRAM W/LOWER EXTREMITY N/A 03/25/2017   Procedure: Abdominal Aortogram w/Lower Extremity;  Surgeon: Nada Libman, MD;  Location: [facility];  Service: Cardiovascular;  Laterality: N/A;  . ABDOMINAL HYSTERECTOMY    . ABDOMINAL SURGERY    . CHOLECYSTECTOMY    . HERNIA REPAIR    . KIDNEY STONE SURGERY    . PERIPHERAL VASCULAR BALLOON ANGIOPLASTY Right 03/25/2017   Procedure: Peripheral Vascular Balloon Angioplasty;  Surgeon: Nada Libman, MD;  Location: [facility];  Service: Cardiovascular;  Laterality: Right;  peroneal   Social History   Socioeconomic History  . Marital status: Widowed    Spouse name: Not on file  . Number of children: Not on file  . Years of education: Not on file  . Highest education level: Not on file  Occupational History  . Not on file  Social Needs  . Financial resource strain: Not on file  . Food insecurity:    Worry: Not on file    Inability: Not on file  . Transportation needs:    Medical: Not on file    Non-medical: Not on file  Tobacco Use  . Smoking status: Former Smoker    Packs/day: 1.00    Years: 40.00    Pack years: 40.00    Types: Cigarettes    Last attempt to quit: 09/24/1991    Years since quitting: 26.8  . Smokeless tobacco: Never Used  Substance and Sexual Activity  . Alcohol use: No  . Drug use: No  . Sexual activity: Not on file  Lifestyle  . Physical activity:    Days per week: Not on file    Minutes per session: Not on file  . Stress: Not on file  Relationships  . Social  connections:    Talks on phone: Not on file    Gets together: Not on file    Attends religious service: Not on file    Active member of club or organization: Not on file    Attends meetings of clubs or organizations: Not on file    Relationship status: Not on file  Other Topics Concern  . Not on file  Social History Narrative  . Not on file   Outpatient Encounter Medications as of 07/09/2018  Medication Sig  . acetaminophen (TYLENOL) 500 MG tablet Take 1,000 mg by mouth every 8 (eight) hours as needed for mild pain or moderate pain.  Marland Kitchen albuterol (PROVENTIL HFA;VENTOLIN HFA) 108 (90 Base) MCG/ACT inhaler Inhale into the lungs every 6 (six) hours as needed for wheezing or shortness of breath.  Marland Kitchen amLODipine (NORVASC) 5 MG tablet Take 5 mg by mouth daily.  Marland Kitchen atorvastatin (LIPITOR) 10 MG tablet Take 1 tablet (10 mg total) by mouth daily at 6 PM.  . busPIRone (BUSPAR) 10  MG tablet Take 10 mg by mouth 3 (three) times daily.  . Calcium-Magnesium-Vitamin D (CALCIUM 500 PO) Take 600 mg by mouth.  . Darbepoetin Alfa (ARANESP) 40 MCG/0.4ML SOSY injection Inject 40 mcg into the skin every 7 (seven) days.  . ergocalciferol (VITAMIN D2) 50000 units capsule Take 50,000 Units by mouth once a week.  . ferrous sulfate 325 (65 FE) MG EC tablet Take 325 mg by mouth daily.  . fludrocortisone (FLORINEF) 0.1 MG tablet Take 0.1 mg by mouth daily.  . fluticasone furoate-vilanterol (BREO ELLIPTA) 100-25 MCG/INH AEPB Inhale 1 puff into the lungs daily.  . furosemide (LASIX) 20 MG tablet Take 1 tablet (20 mg total) by mouth daily.  Marland Kitchen HYDROcodone-acetaminophen (NORCO/VICODIN) 5-325 MG tablet Take 1 tablet by mouth every 6 (six) hours as needed for moderate pain.  . metoprolol succinate (TOPROL-XL) 25 MG 24 hr tablet Take 1 tablet (25 mg total) by mouth daily.  . mirtazapine (REMERON) 30 MG tablet Take 30 mg by mouth at bedtime.  . Multiple Vitamin (MULTIVITAMIN) tablet Take 1 tablet by mouth daily.  . QUEtiapine  (SEROQUEL) 25 MG tablet Take 25 mg by mouth at bedtime.  . fenofibrate micronized (LOFIBRA) 67 MG capsule Take 67 mg by mouth daily before breakfast.  . [DISCONTINUED] benazepril (LOTENSIN) 10 MG tablet Take 0.5 tablets (5 mg total) by mouth daily.  . [DISCONTINUED] clopidogrel (PLAVIX) 75 MG tablet Take 1 tablet (75 mg total) by mouth daily.  . [DISCONTINUED] hydrochlorothiazide (HYDRODIURIL) 25 MG tablet Take 25 mg by mouth daily.  . [DISCONTINUED] HYDROcodone-acetaminophen (NORCO/VICODIN) 5-325 MG tablet Take 1 tablet by mouth every 6 (six) hours as needed for moderate pain.  . [DISCONTINUED] metoprolol succinate (TOPROL-XL) 25 MG 24 hr tablet Take 1 tablet by mouth every day  . [DISCONTINUED] metoprolol succinate (TOPROL-XL) 25 MG 24 hr tablet Take 1 tablet (25 mg total) by mouth daily.  . [DISCONTINUED] sulfamethoxazole-trimethoprim (BACTRIM,SEPTRA) 400-80 MG tablet Take 1 tablet by mouth 2 (two) times daily.  . [DISCONTINUED] tolterodine (DETROL LA) 4 MG 24 hr capsule Take 4 mg by mouth daily.  . [DISCONTINUED] Vitamin D, Ergocalciferol, (DRISDOL) 50000 units CAPS capsule Take 50,000 Units by mouth every 7 (seven) days.   No facility-administered encounter medications on file as of 07/09/2018.    ALLERGIES: Allergies  Allergen Reactions  . Ativan [Lorazepam] Other (See Comments)    Hallucinations   . Ciprocin-Fluocin-Procin [Fluocinolone] Diarrhea  . Ibuprofen Other (See Comments)    Due to abd surgery   . Versed [Midazolam] Other (See Comments)  . Aspirin Other (See Comments)    Due to abd surgery   . Avelox [Moxifloxacin Hcl In Nacl] Rash  . Tape Rash    VACCINATION STATUS:  There is no immunization history on file for this patient.  HPI Kristy Espinoza is 84 y.o. female who presents today with a medical history as above. she multiple medical problems, wheelchair-bound, nursing home resident.  She is being seen in consultation for history of abnormal thyroid function  tests suggesting hyperthyroidism requested by nursing home provider.  -Her most recent thyroid function test involves only TSH from June 30, 2018 when it was normal at 3.1. -She is accompanied by her grown daughter and son-in-law who described that she has been losing weight despite good appetite, became more anxious lately. -She is wheelchair-bound at baseline.  She denies palpitations, heat or cold intolerance.   Review of Systems  Constitutional: + weight loss, no fatigue, no subjective hyperthermia, no subjective hypothermia Eyes:  no blurry vision, no xerophthalmia ENT: no sore throat, no nodules palpated in throat, no dysphagia/odynophagia, no hoarseness Cardiovascular: no Chest Pain, no Shortness of Breath, no palpitations, no leg swelling Respiratory: no cough, no SOB Gastrointestinal: no Nausea/Vomiting/Diarhhea Musculoskeletal: + Bilateral lower extremity edema Skin: no rashes Neurological: + tremors, no numbness, no tingling, no dizziness Psychiatric: no depression, + anxiety  Objective:    BP 110/68   Pulse 79   Wt Readings from Last 3 Encounters:  03/16/18 115 lb 9.6 oz (52.4 kg)  05/12/17 126 lb 12.8 oz (57.5 kg)  03/25/17 125 lb (56.7 kg)    Physical Exam  Constitutional: + In a wheelchair, not in acute distress. Eyes: PERRLA, EOMI, no exophthalmos ENT: moist mucous membranes, no thyromegaly, no cervical lymphadenopathy, + poor dental health. Cardiovascular: normal precordial activity, Regular Rate and Rhythm, no Murmur/Rubs/Gallops Respiratory:  adequate breathing efforts, no gross chest deformity, + wheezes and rales bilaterally.   Gastrointestinal: abdomen soft, Non -tender, No distension, Bowel Sounds present Musculoskeletal: + Bilateral lower extremity pitting edema, cellulitis/ulcer on right shin wrapped in clean gauze,  strength intact in all four extremities Skin: moist, warm, no rashes Neurological: + Gross tremor with outstretched hands  CMP ( most  recent) CMP     Component Value Date/Time   NA 144 03/25/2017 0953   K 4.1 03/25/2017 0953   CL 109 03/25/2017 0953   CO2 24 11/07/2016 0635   GLUCOSE 88 03/25/2017 0953   BUN 57 (A) 06/30/2018   CREATININE 1.4 (A) 06/30/2018   CREATININE 1.70 (H) 03/25/2017 0953   CALCIUM 9.1 11/07/2016 0635   PROT 6.3 (L) 11/02/2016 1436   ALBUMIN 3.5 11/02/2016 1436   AST 34 11/02/2016 1436   ALT 21 11/02/2016 1436   ALKPHOS 71 11/02/2016 1436   BILITOT 0.7 11/02/2016 1436   GFRNONAA 24 (L) 11/07/2016 0635   GFRAA 27 (L) 11/07/2016 0635     Diabetic Labs (most recent): No results found for: HGBA1C   Lipid Panel ( most recent) Lipid Panel     Component Value Date/Time   CHOL 147 11/05/2016 0629   TRIG 217 (H) 11/05/2016 0629   HDL 46 11/05/2016 0629   CHOLHDL 3.2 11/05/2016 0629   VLDL 43 (H) 11/05/2016 0629   LDLCALC 58 11/05/2016 0629      Lab Results  Component Value Date   TSH 3.10 06/30/2018   TSH 1.174 11/03/2016       Assessment & Plan:   1. Abnormal thyroid blood test 2.  Loss of weight - Kristy Espinoza  is being seen at a kind request of her nursing home provider.  - I have reviewed her available thyroid records and clinically evaluated the patient. -Her accompanying records show insufficient information to proceed with definitive treatment plan.  -She presents with nonspecific symptoms including weight loss despite fair appetite.  she will need a repeat,  more complete thyroid function tests towards confirming the diagnosis. -I approached her for more labs today including TSH, free T4/free T3, and antithyroid antibodies.   -Due to transportation issues with her, she will be called with results. She   will return to clinic only if there will be any need for intervention.  -I did not initiate any new prescriptions for her today. -I suggested bilateral leg elevation while sitting and while in bed to help with the dependent bilateral lower extremity edema.   -  Time spent with the patient: 30 minutes, of which >50% was spent in obtaining information  about her symptoms, reviewing her previous labs, evaluations, and treatments, counseling her about her abnormal thyroid function tests, and developing a plan to confirm the diagnosis and long term treatment as necessary.  Kristy Espinoza participated in the discussions, expressed understanding, and voiced agreement with the above plans.  All questions were answered to her satisfaction. she is encouraged to contact clinic should she have any questions or concerns prior to her return visit.  Follow up plan: Return will call with results.   Marquis Lunch, MD [facility] [address] [place], [place] [address] Phone: [phone]  Fax: [phone]     07/09/2018, 12:23 PM  This note was partially dictated with voice recognition software. Similar sounding words can be transcribed inadequately or may not  be corrected upon review.

## 2018-07-10 ENCOUNTER — Other Ambulatory Visit: Payer: Self-pay | Admitting: "Endocrinology

## 2018-07-10 DIAGNOSIS — E039 Hypothyroidism, unspecified: Secondary | ICD-10-CM

## 2018-07-10 LAB — TSH: TSH: 5.49 mIU/L — ABNORMAL HIGH (ref 0.40–4.50)

## 2018-07-10 LAB — THYROID PEROXIDASE ANTIBODY: Thyroperoxidase Ab SerPl-aCnc: <1 IU/mL (ref <9)

## 2018-07-10 LAB — T4, FREE: Free T4: 1 ng/dL (ref 0.8–1.8)

## 2018-07-10 LAB — T3, FREE: T3, Free: 2.2 pg/mL — ABNORMAL LOW (ref 2.3–4.2)

## 2018-07-10 LAB — THYROGLOBULIN ANTIBODY: THYROGLOBULIN AB: 83 [IU]/mL — AB (ref <=1)

## 2018-07-10 MED ORDER — LEVOTHYROXINE SODIUM 25 MCG PO TABS: 25.0000 ug | ORAL_TABLET | Freq: Every day | ORAL | 3 refills | Status: AC

## 2018-07-10 MED ORDER — LEVOTHYROXINE SODIUM 25 MCG PO TABS: 25.0000 ug | ORAL_TABLET | Freq: Every day | ORAL | 3 refills | Status: DC

## 2018-07-10 NOTE — Progress Notes (Signed)
Done

## 2018-07-10 NOTE — Progress Notes (Signed)
Rx needs to be faxed to [facility]) at [phone]

## 2018-08-23 DEATH — deceased

## 2018-10-13 ENCOUNTER — Ambulatory Visit: Payer: Medicare Other | Admitting: "Endocrinology
